# Patient Record
Sex: Female | Born: 1970 | Race: White | Hispanic: No | Marital: Single | Smoking: Current every day smoker
Health system: Southern US, Community
[De-identification: ages and names within clinical notes are randomized; demographics above are authoritative.]

## PROBLEM LIST (undated history)

## (undated) HISTORY — PX: OTHER SURGICAL HISTORY: SHX169

---

## 2008-09-08 ENCOUNTER — Ambulatory Visit: Payer: Self-pay | Admitting: Obstetrics & Gynecology

## 2008-09-08 LAB — CONVERTED CEMR LAB
Antibody Screen: NEGATIVE
Basophils Absolute: 0 10*3/uL (ref 0.0–0.1)
Basophils Relative: 0 % (ref 0–1)
Eosinophils Absolute: 0.1 10*3/uL (ref 0.0–0.7)
Eosinophils Relative: 1 % (ref 0–5)
HCT: 36 % (ref 36.0–46.0)
Hemoglobin: 12.1 g/dL (ref 12.0–15.0)
Hepatitis B Surface Ag: NEGATIVE
Lymphocytes Relative: 16 % (ref 12–46)
Lymphs Abs: 1.3 10*3/uL (ref 0.7–4.0)
MCHC: 33.6 g/dL (ref 30.0–36.0)
MCV: 89.3 fL (ref 78.0–100.0)
Monocytes Absolute: 0.5 10*3/uL (ref 0.1–1.0)
Monocytes Relative: 6 % (ref 3–12)
Neutro Abs: 6.2 10*3/uL (ref 1.7–7.7)
Neutrophils Relative %: 76 % (ref 43–77)
Platelets: 277 10*3/uL (ref 150–400)
RBC: 4.03 M/uL (ref 3.87–5.11)
RDW: 12.9 % (ref 11.5–15.5)
Rh Type: POSITIVE
Rubella: 42.7 intl units/mL — ABNORMAL HIGH
WBC: 8.1 10*3/uL (ref 4.0–10.5)

## 2008-09-09 ENCOUNTER — Ambulatory Visit (HOSPITAL_COMMUNITY): Admission: RE | Admit: 2008-09-09 | Discharge: 2008-09-09 | Payer: Self-pay | Admitting: Obstetrics & Gynecology

## 2008-09-09 IMAGING — US US OB DETAIL+14 WK
1 series · 14 of 28 positions shown · non-contrast
Comparison: none

OBSTETRICAL ULTRASOUND:
 This ultrasound exam was performed in the [HOSPITAL] Ultrasound Department.  The OB US report was generated in the AS system, and faxed to the ordering physician.  This report is also available in [REDACTED] PACS.

[Series 1: us ob detail +14 wk · 0.20mm/px · 57 acquisitions, 14 frames shown]
[im 3/57]
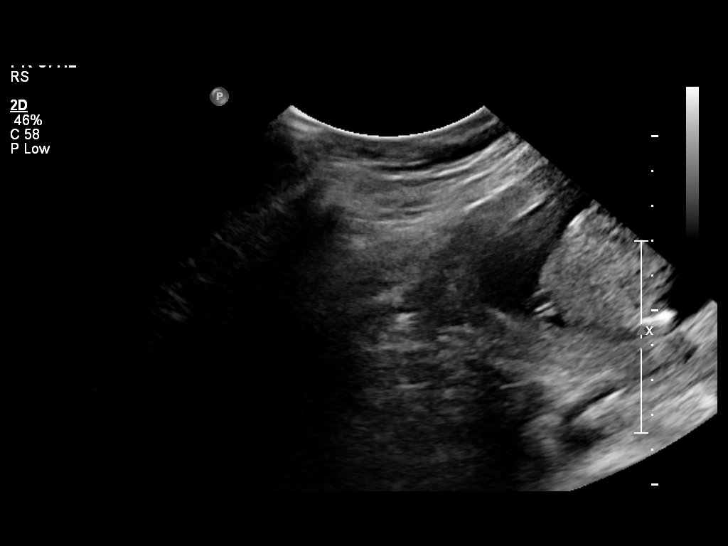
[im 7/57]
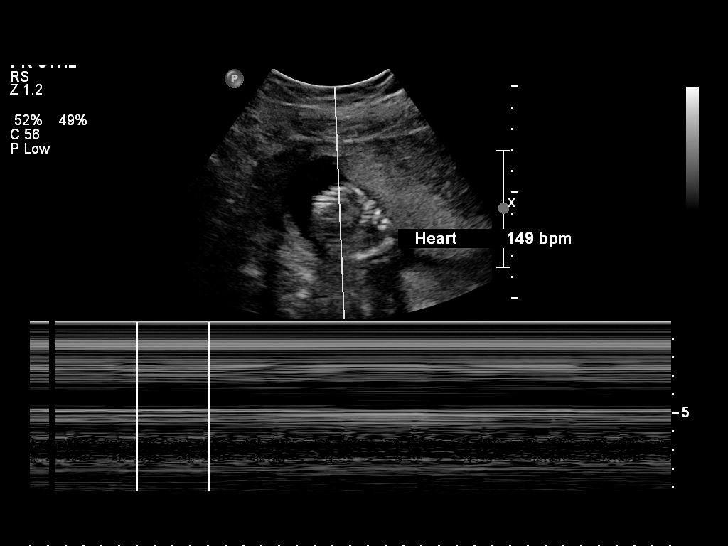
[im 11/57]
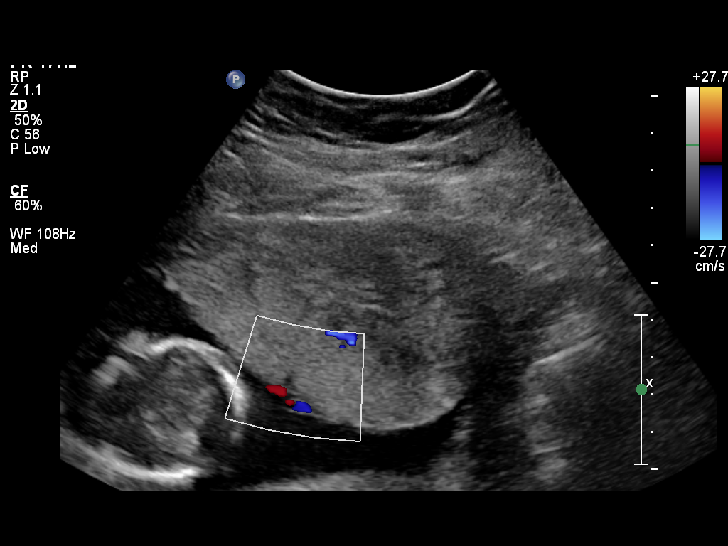
[im 15/57]
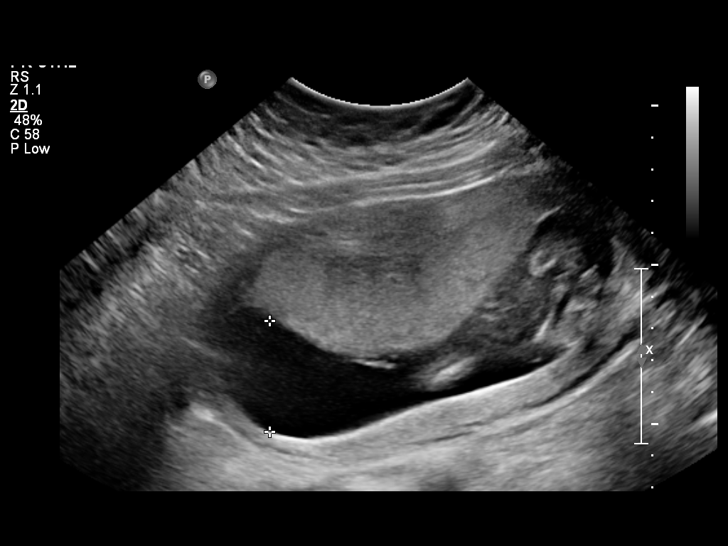
[im 19/57]
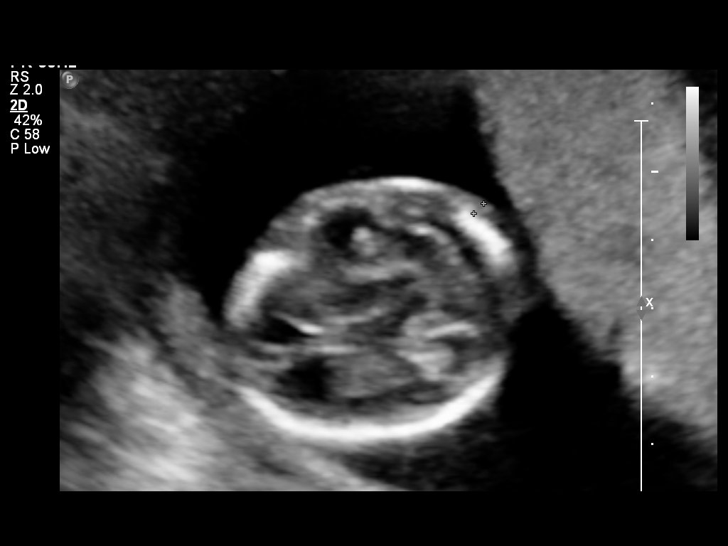
[im 23/57]
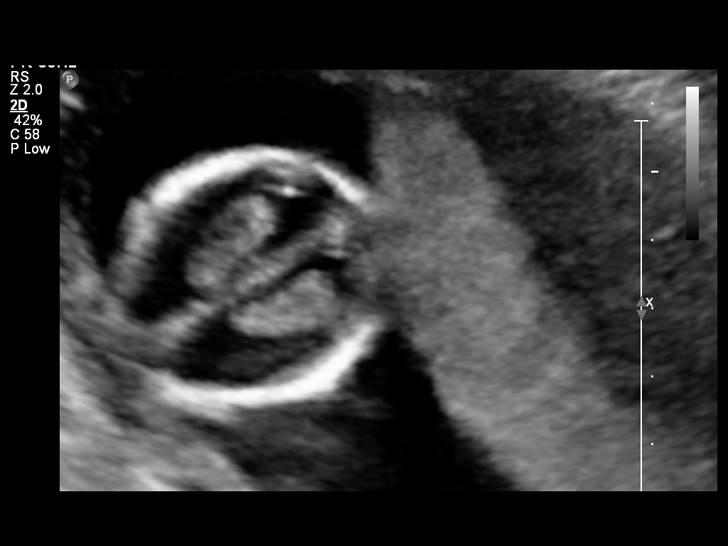
[im 27/57]
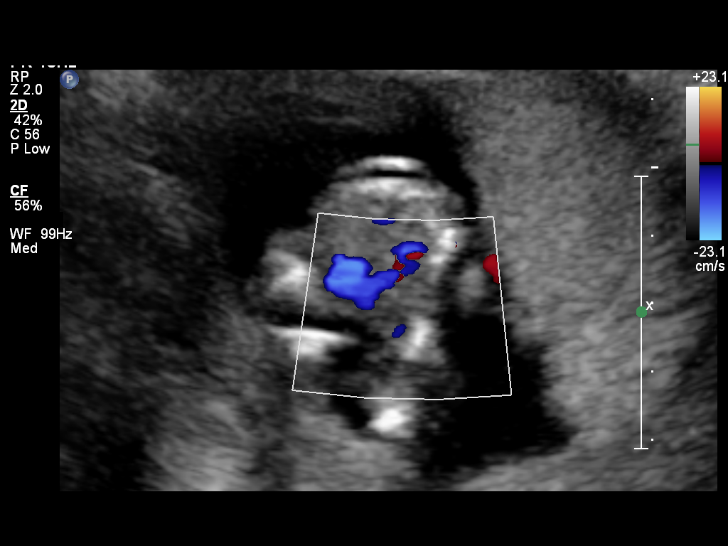
[im 32/57]
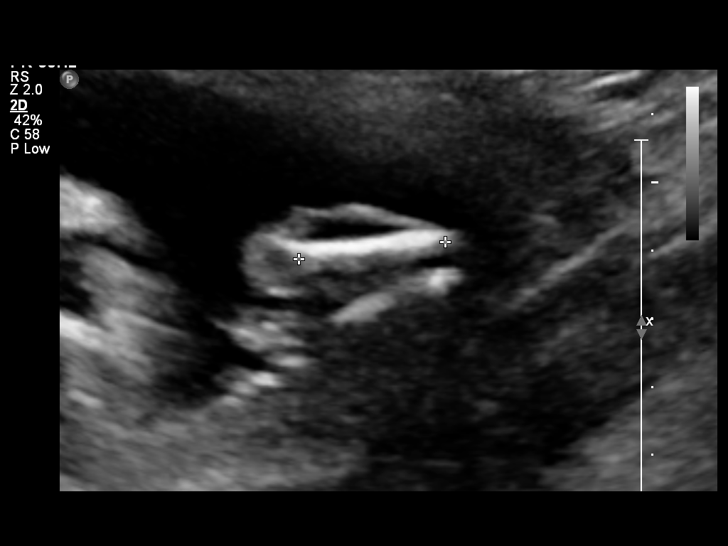
[im 36/57]
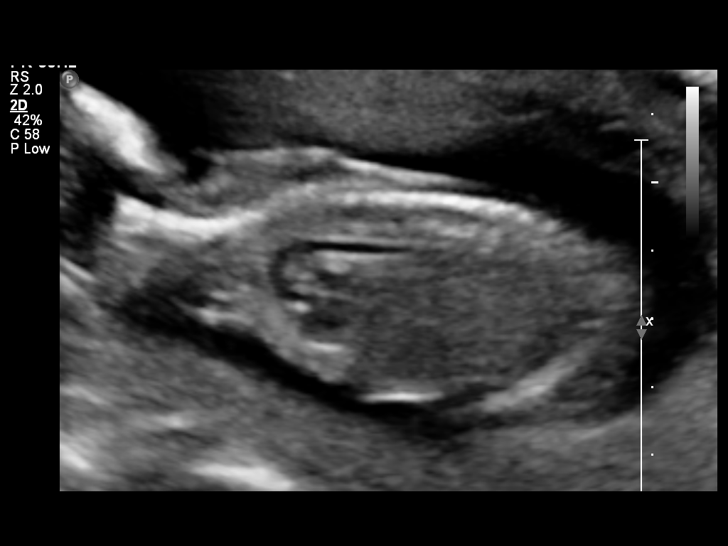
[im 40/57]
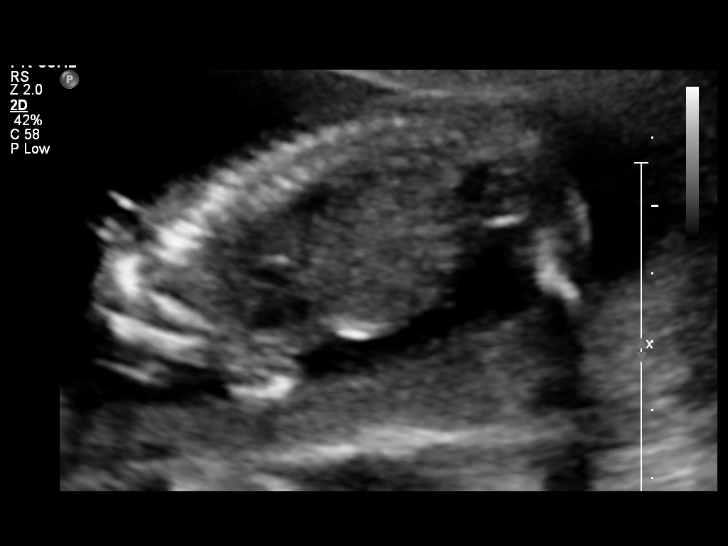
[im 44/57]
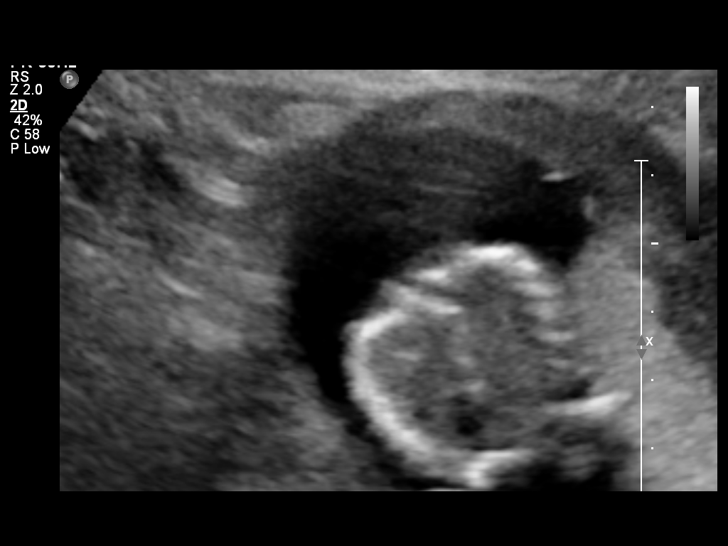
[im 48/57]
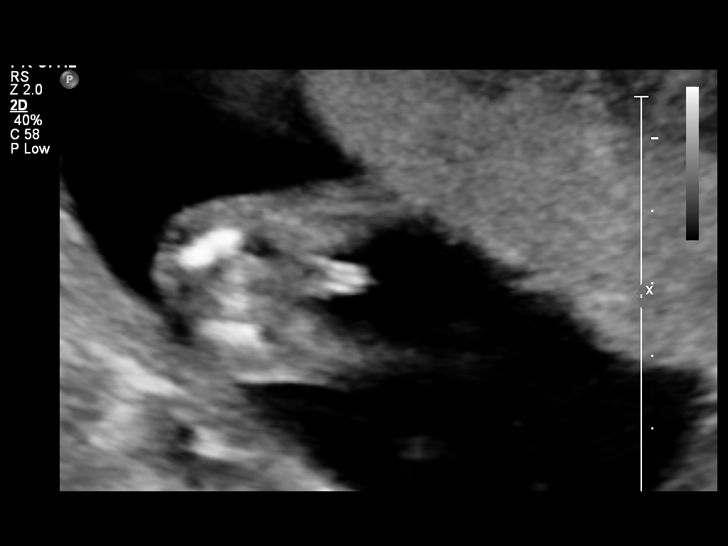
[im 52/57]
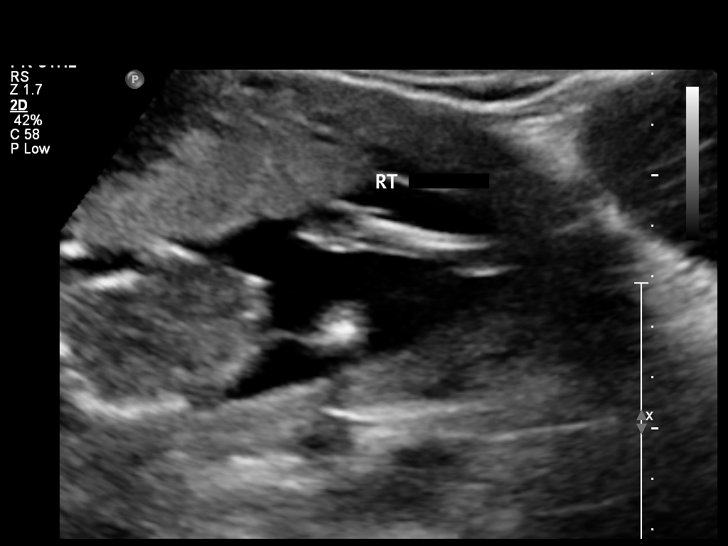
[im 57/57]
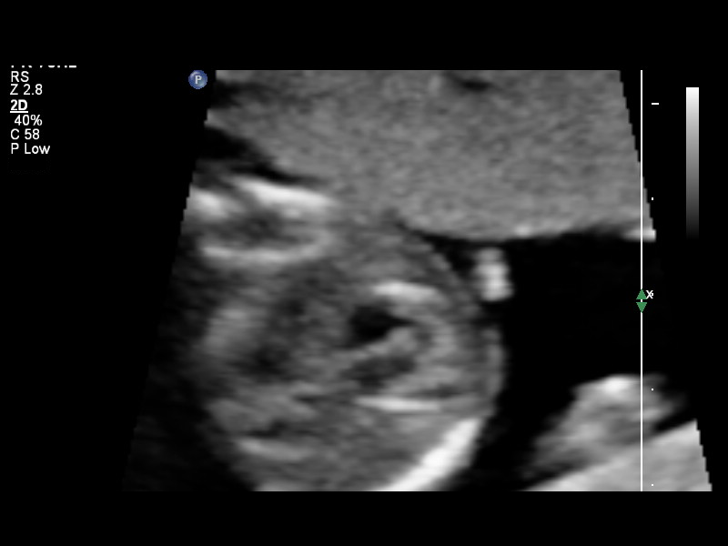

[14 of 28 positions shown; findings below may reference images not displayed]

IMPRESSION: See AS Obstetric US report.

## 2008-09-10 ENCOUNTER — Ambulatory Visit: Payer: Self-pay | Admitting: Obstetrics & Gynecology

## 2008-09-10 ENCOUNTER — Encounter: Payer: Self-pay | Admitting: Obstetrics & Gynecology

## 2008-10-01 ENCOUNTER — Ambulatory Visit (HOSPITAL_COMMUNITY): Admission: RE | Admit: 2008-10-01 | Discharge: 2008-10-01 | Payer: Self-pay | Admitting: Family Medicine

## 2008-10-01 IMAGING — US US OB FOLLOW-UP
1 series · 14 of 28 positions shown · non-contrast
Comparison: none

OBSTETRICAL ULTRASOUND:
 This ultrasound exam was performed in the [HOSPITAL] Ultrasound Department.  The OB US report was generated in the AS system, and faxed to the ordering physician.  This report is also available in [REDACTED] PACS.

[Series 1: us ob follow up · 0.27mm/px · 14 of 52 slices shown]
[im 2/52]
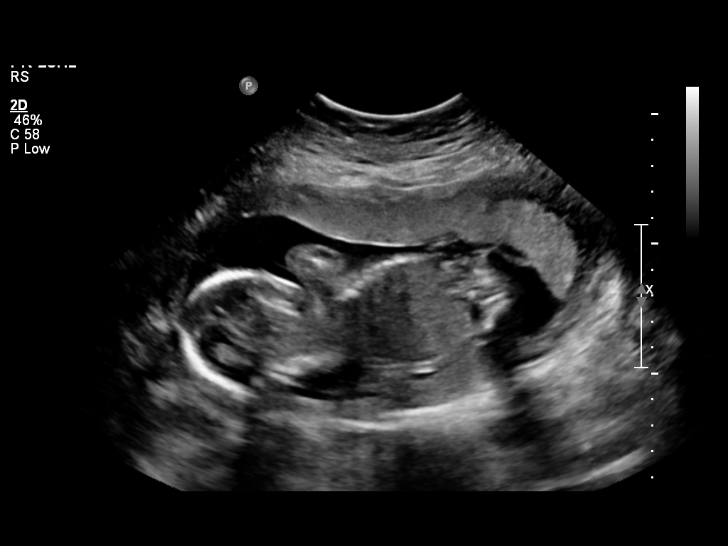
[im 6/52]
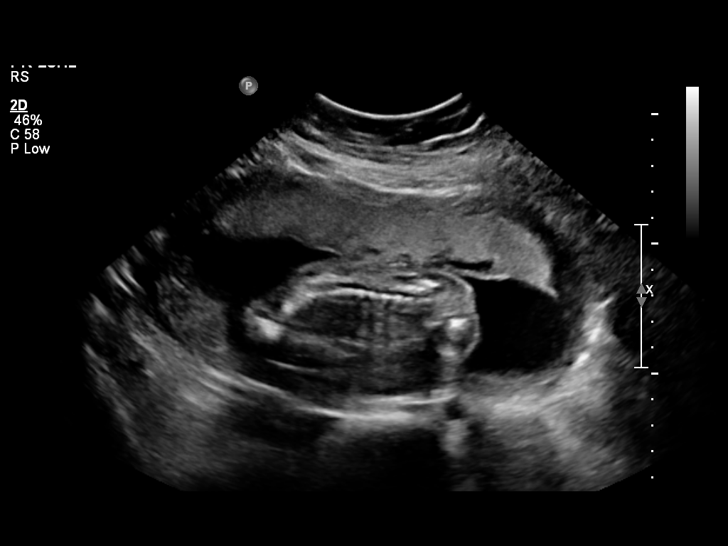
[im 10/52]
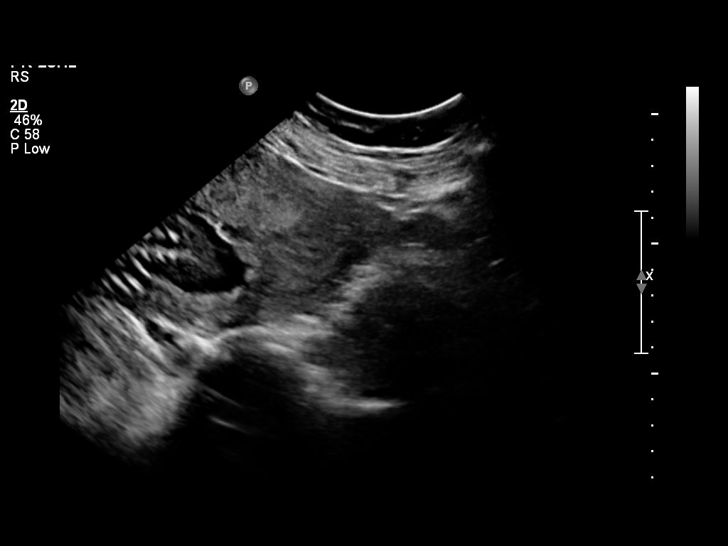
[im 14/52]
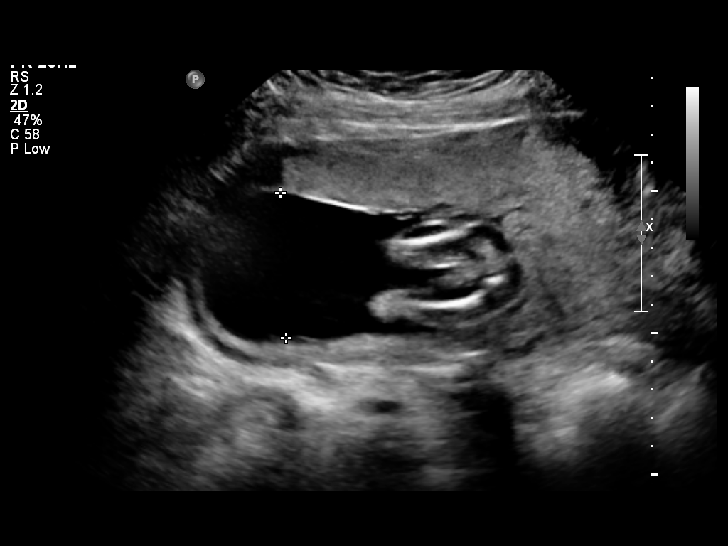
[im 18/52]
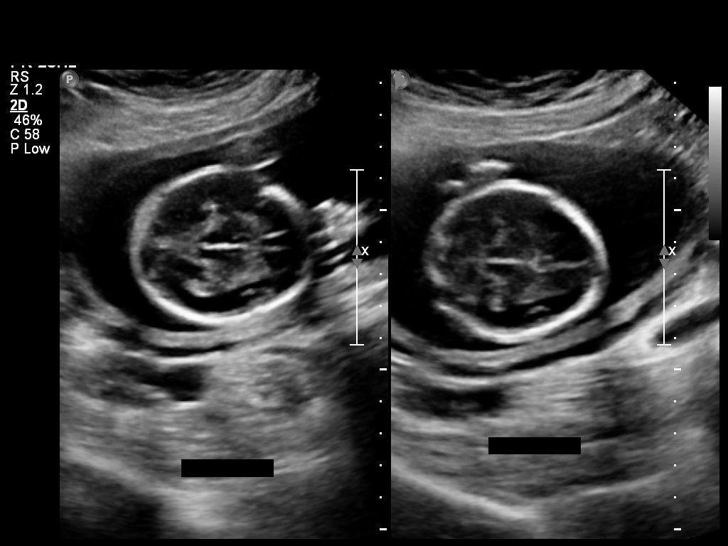
[im 21/52]
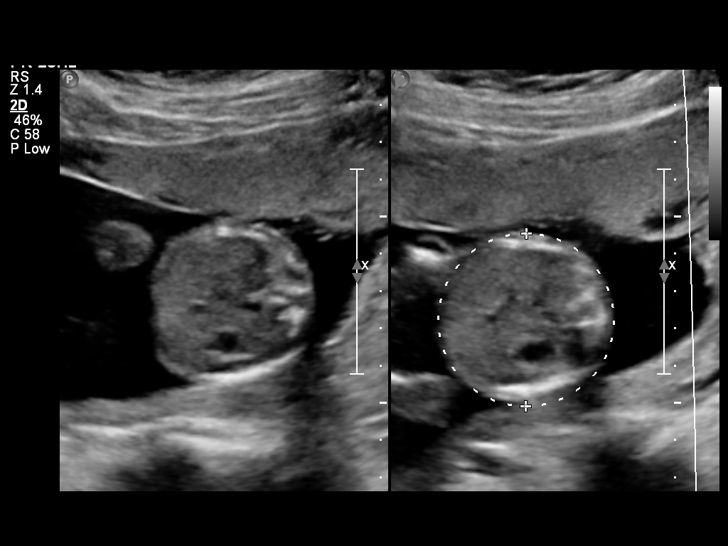
[im 25/52]
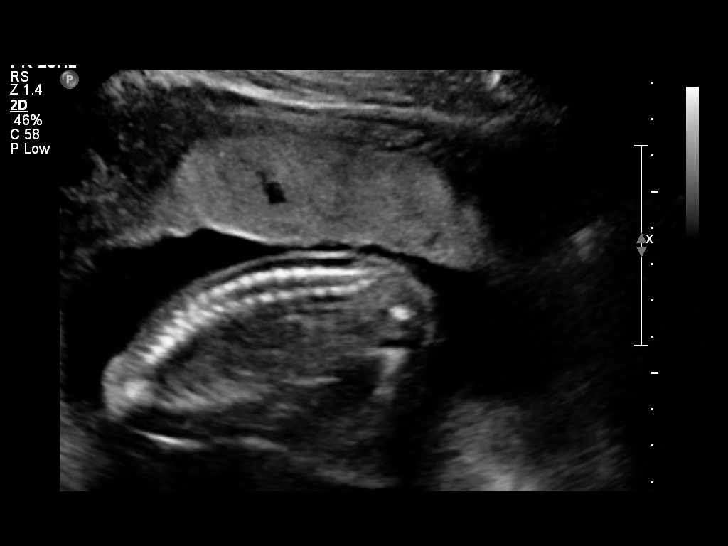
[im 29/52]
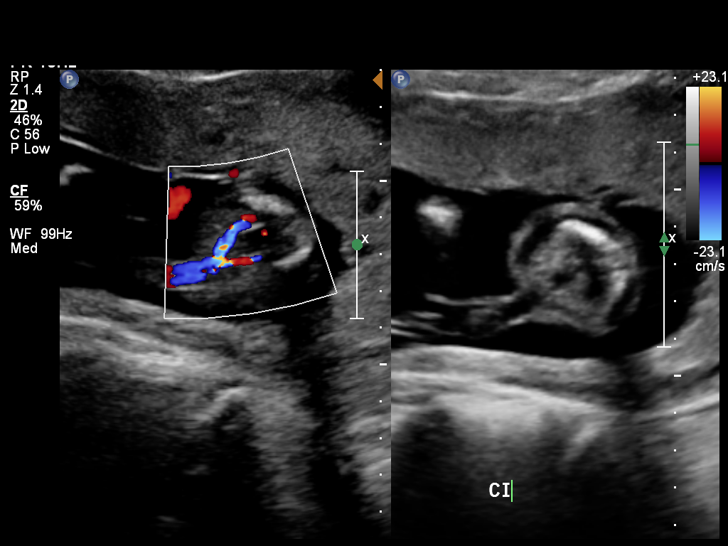
[im 33/52]
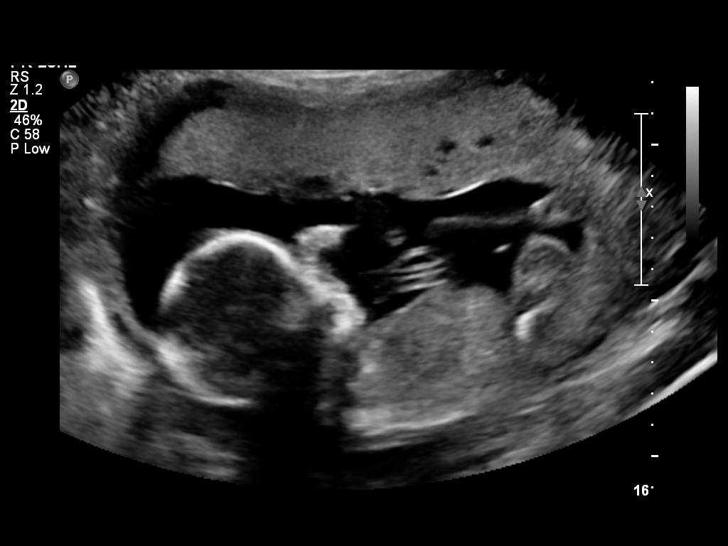
[im 36/52]
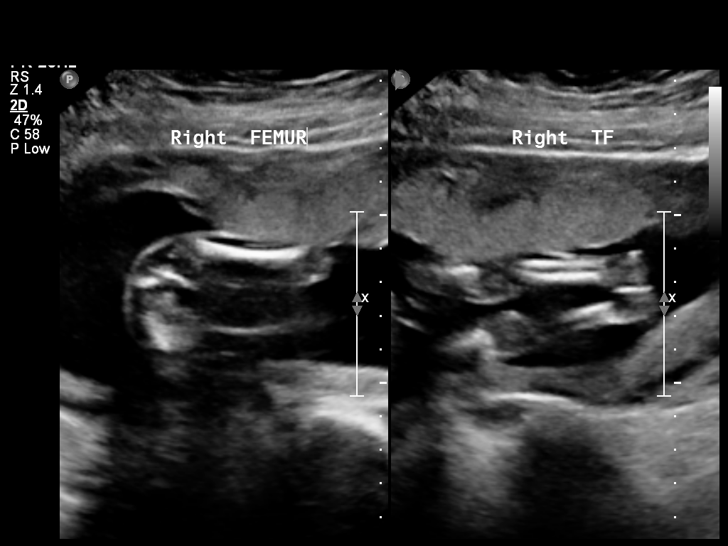
[im 40/52]
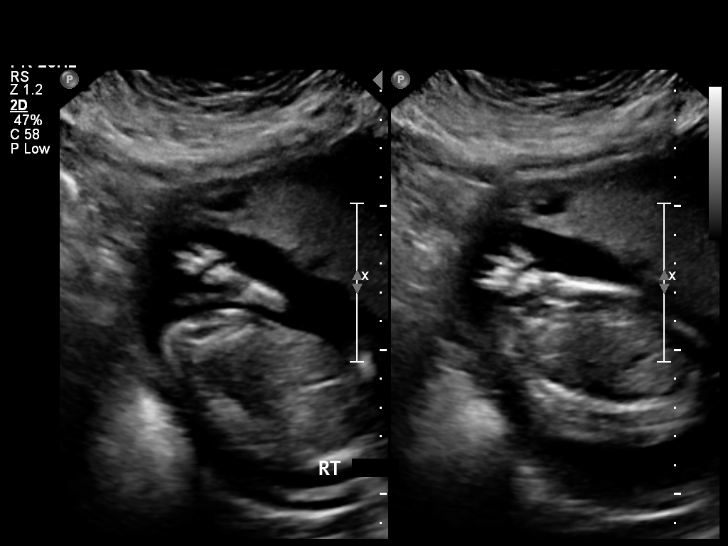
[im 44/52]
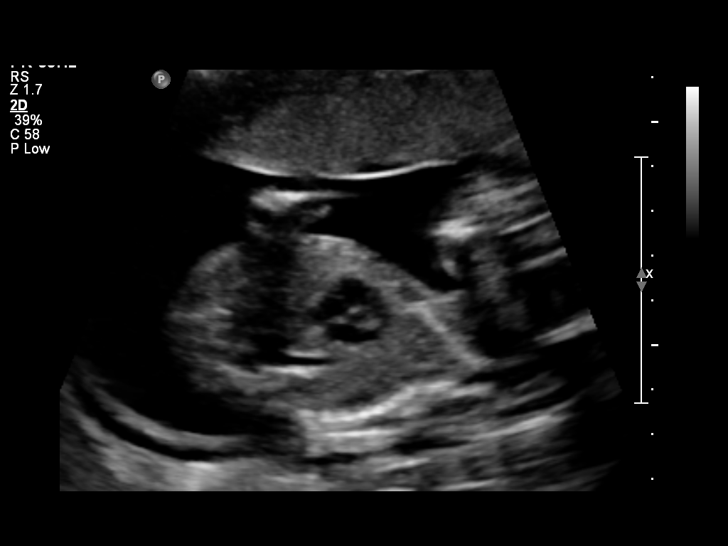
[im 48/52]
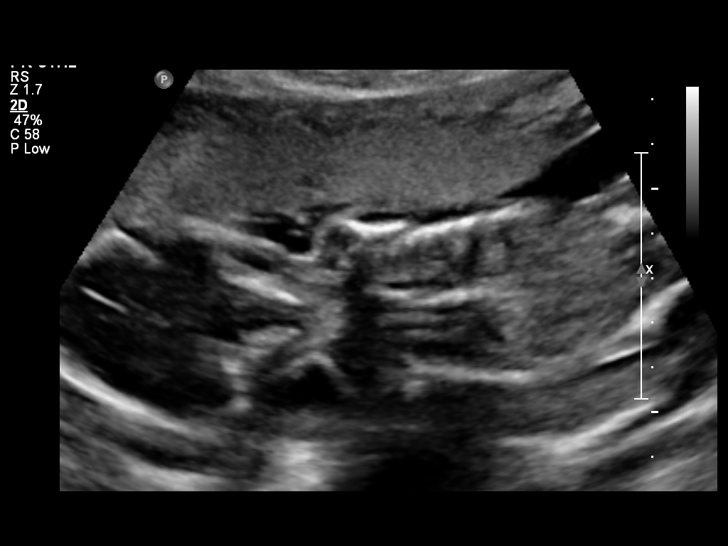
[im 52/52]
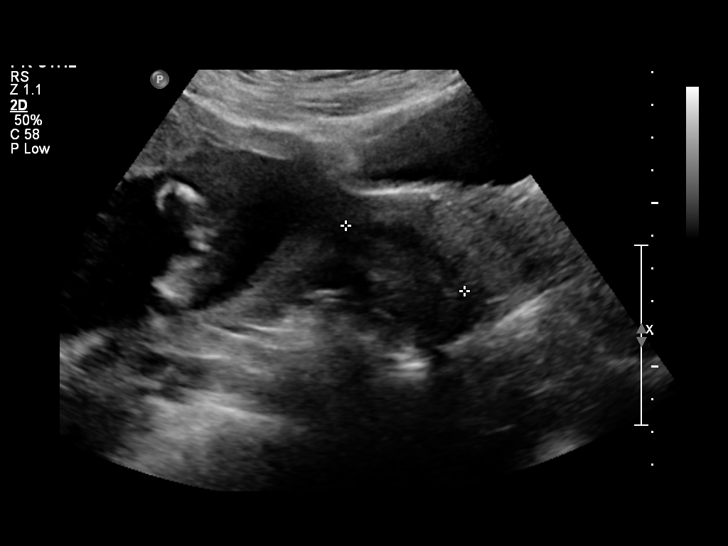

[14 of 28 positions shown; findings below may reference images not displayed]

IMPRESSION: See AS Obstetric US report.

## 2008-10-08 ENCOUNTER — Ambulatory Visit: Payer: Self-pay | Admitting: Family Medicine

## 2008-11-09 ENCOUNTER — Ambulatory Visit: Payer: Self-pay | Admitting: Obstetrics & Gynecology

## 2008-11-12 ENCOUNTER — Ambulatory Visit (HOSPITAL_COMMUNITY): Admission: RE | Admit: 2008-11-12 | Discharge: 2008-11-12 | Payer: Self-pay | Admitting: Family Medicine

## 2008-11-12 IMAGING — US US OB DETAIL+14 WK
1 series · 18 of 28 positions shown · non-contrast
Comparison: none

OBSTETRICAL ULTRASOUND:
 This ultrasound was performed in The [HOSPITAL], and the AS OB/GYN report will be stored to [REDACTED] PACS.

[Series 1: us ob detail+14 wk · 18 of 62 slices shown]
[im 1/62]
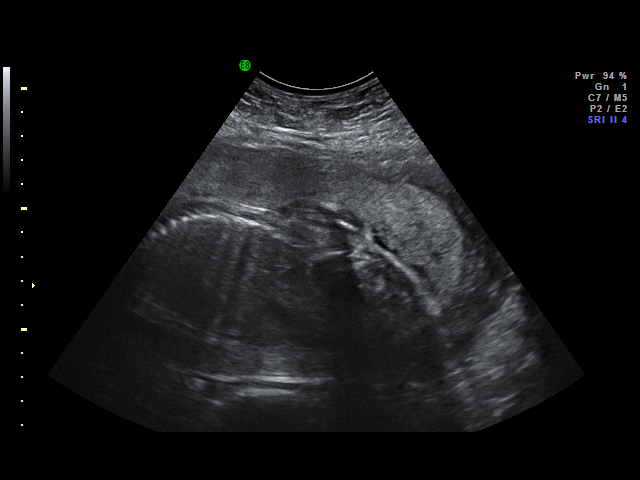
[im 5/62]
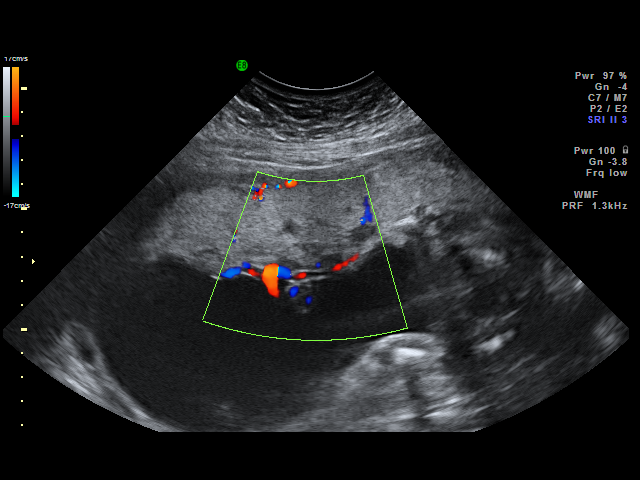
[im 7/62]
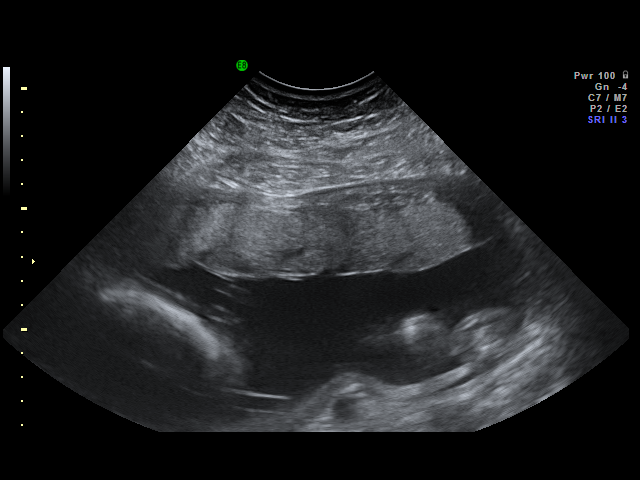
[im 12/62]
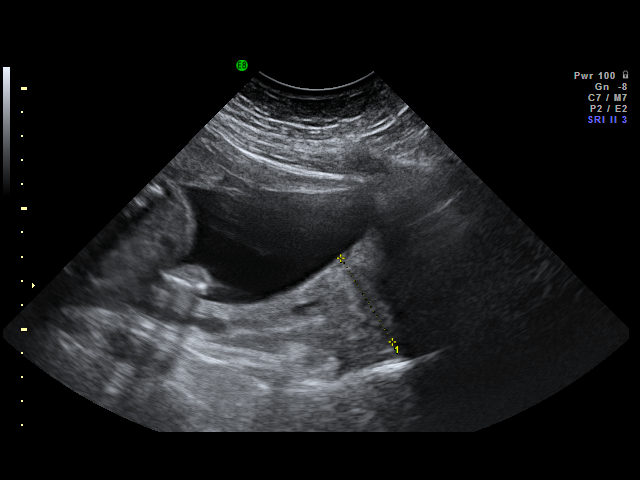
[im 16/62]
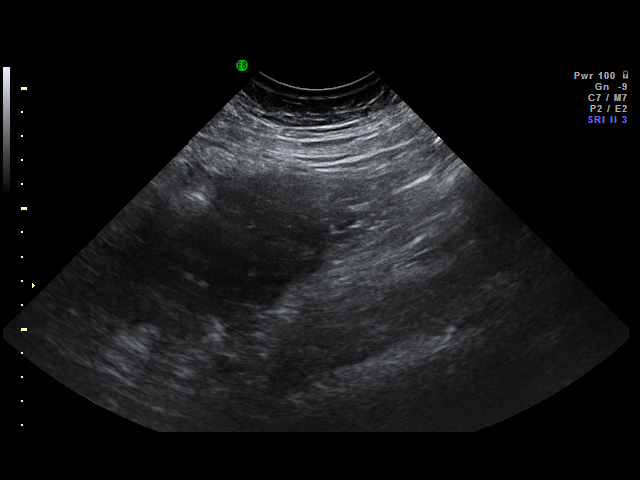
[im 19/62]
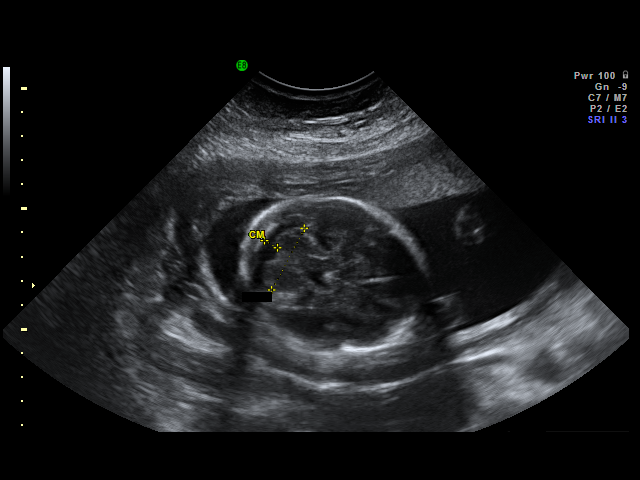
[im 23/62]
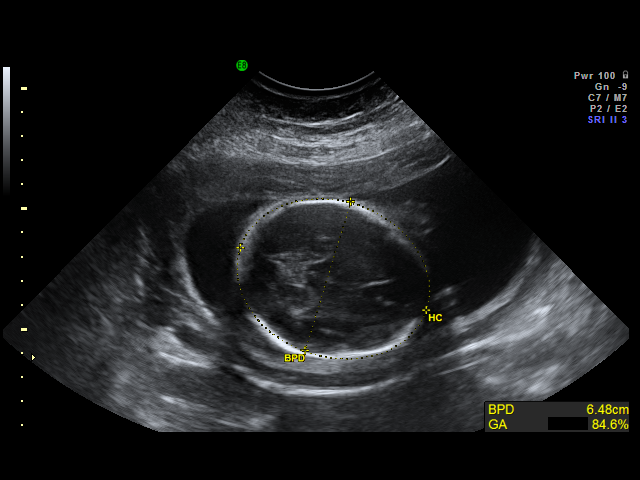
[im 25/62]
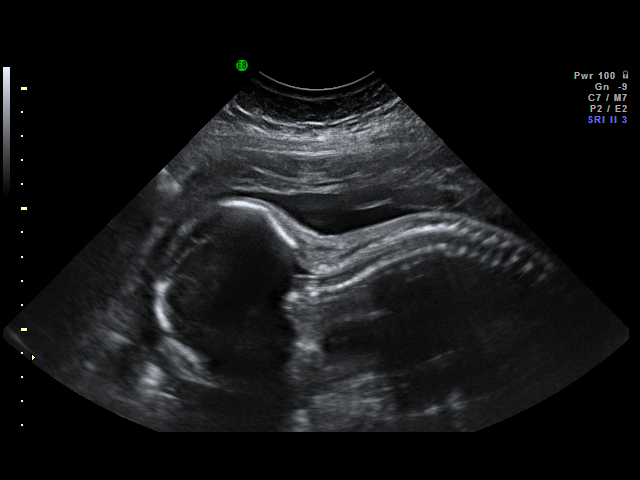
[im 30/62]
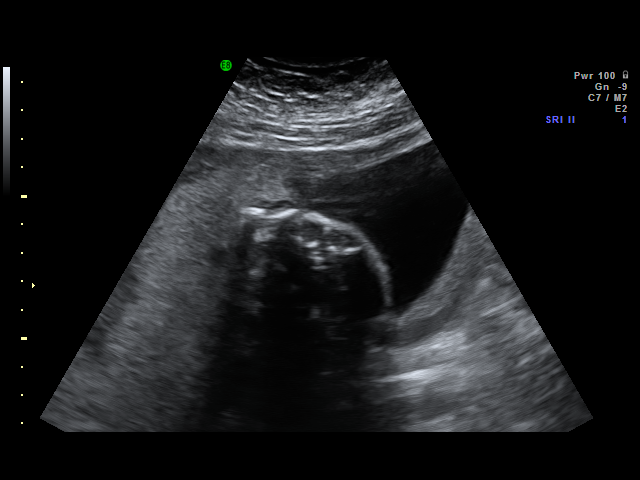
[im 32/62]
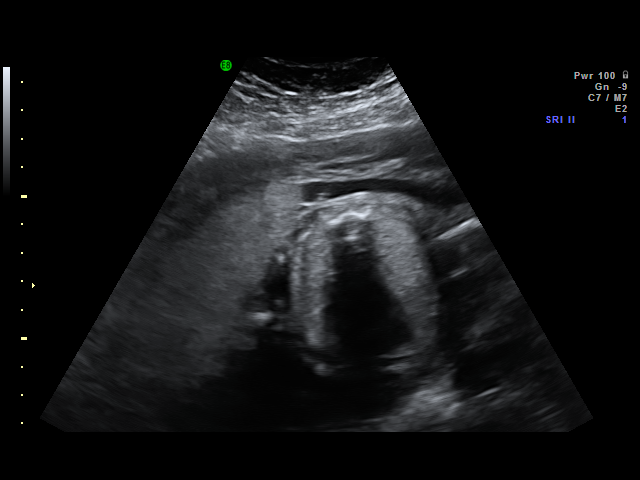
[im 37/62]
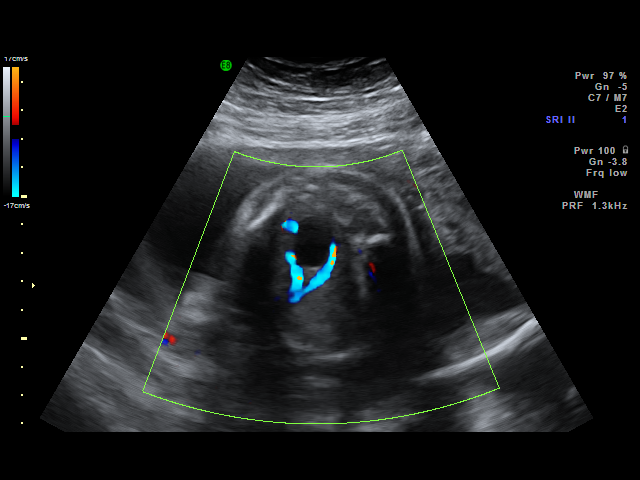
[im 39/62]
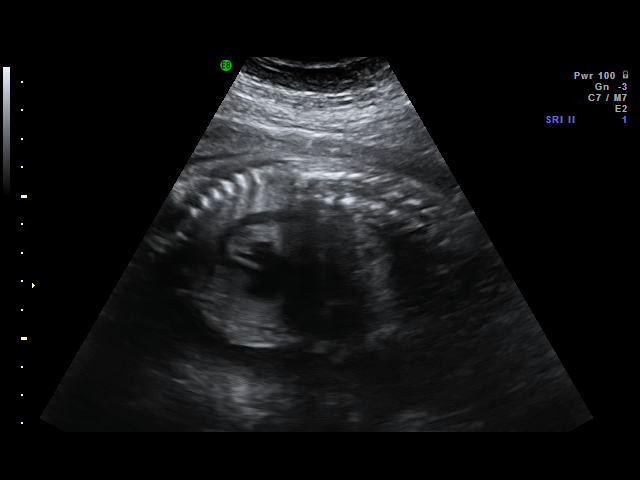
[im 43/62]
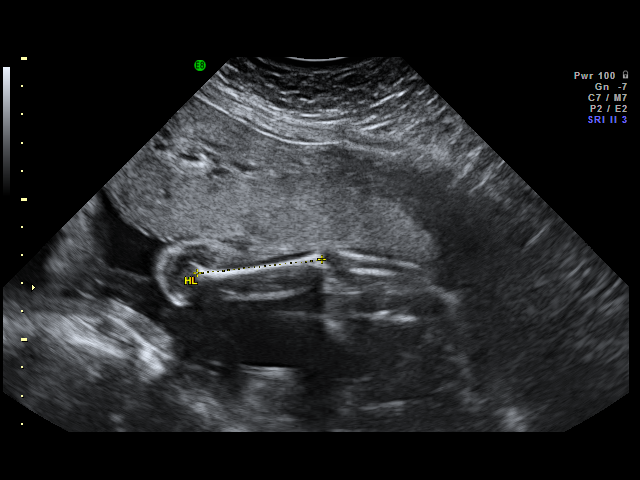
[im 48/62]
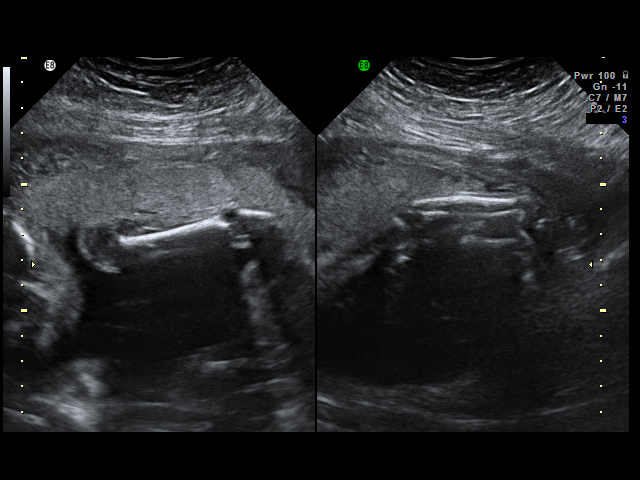
[im 50/62]
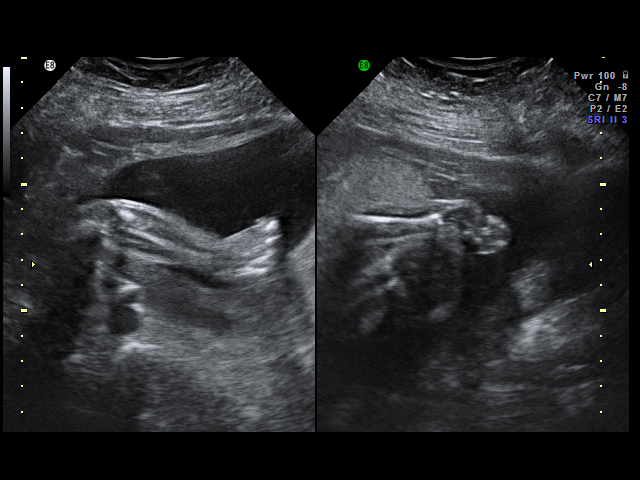
[im 55/62]
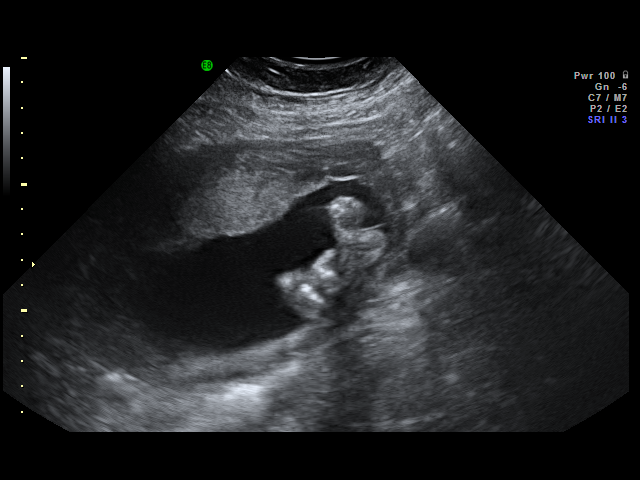
[im 57/62]
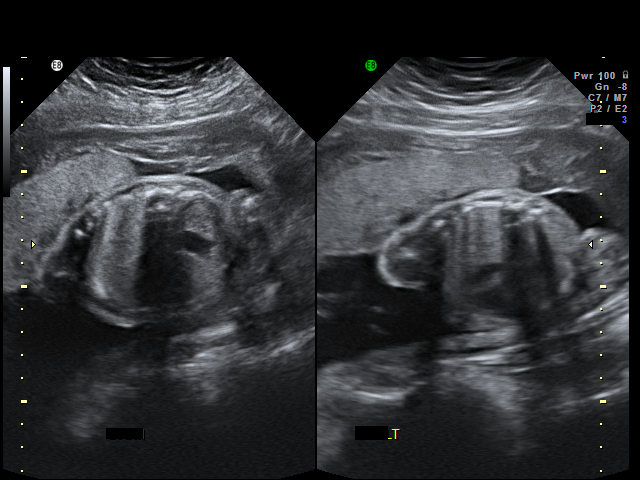
[im 62/62]
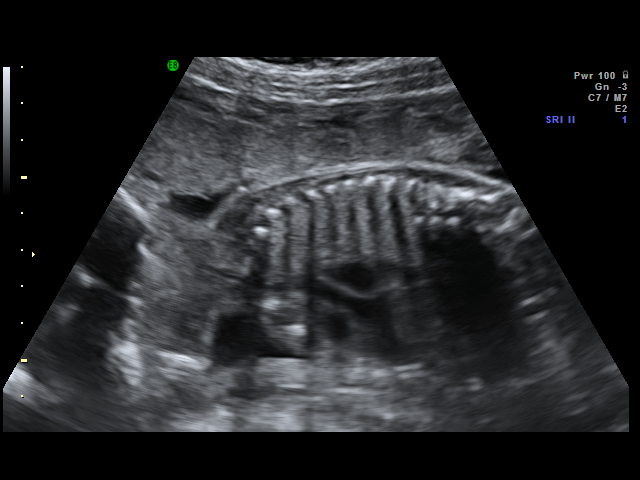

[18 of 28 positions shown; findings below may reference images not displayed]

IMPRESSION: AS OB/GYN has also been faxed to the ordering physician.

## 2008-11-23 ENCOUNTER — Ambulatory Visit: Payer: Self-pay | Admitting: Obstetrics & Gynecology

## 2008-11-23 LAB — CONVERTED CEMR LAB
HCT: 37 % (ref 36.0–46.0)
Hemoglobin: 12 g/dL (ref 12.0–15.0)
MCHC: 32.4 g/dL (ref 30.0–36.0)
MCV: 94.4 fL (ref 78.0–100.0)
Platelets: 251 10*3/uL (ref 150–400)
RBC: 3.92 M/uL (ref 3.87–5.11)
RDW: 12.8 % (ref 11.5–15.5)
WBC: 9.2 10*3/uL (ref 4.0–10.5)

## 2008-11-30 ENCOUNTER — Encounter: Payer: Self-pay | Admitting: Obstetrics & Gynecology

## 2008-12-10 ENCOUNTER — Encounter: Payer: Self-pay | Admitting: Family Medicine

## 2008-12-10 ENCOUNTER — Ambulatory Visit (HOSPITAL_COMMUNITY): Admission: RE | Admit: 2008-12-10 | Discharge: 2008-12-10 | Payer: Self-pay | Admitting: Family Medicine

## 2008-12-10 IMAGING — US US OB FOLLOW-UP
1 series · 14 of 28 positions shown · non-contrast
Comparison: none

OBSTETRICAL ULTRASOUND:
 This ultrasound was performed in The [HOSPITAL], and the AS OB/GYN report will be stored to [REDACTED] PACS.  This report is also available in [HOSPITAL]?s accessANYware.

[Series 1: us ob follow-up · 14 of 31 slices shown]
[im 2/31]
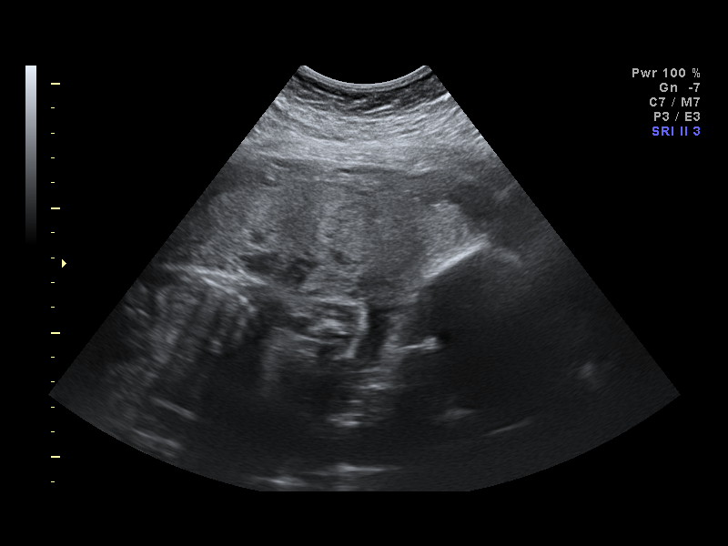
[im 4/31]
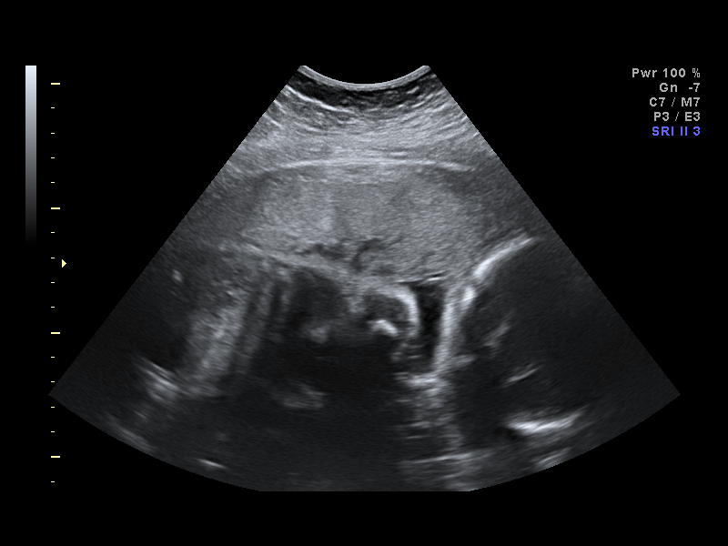
[im 6/31]
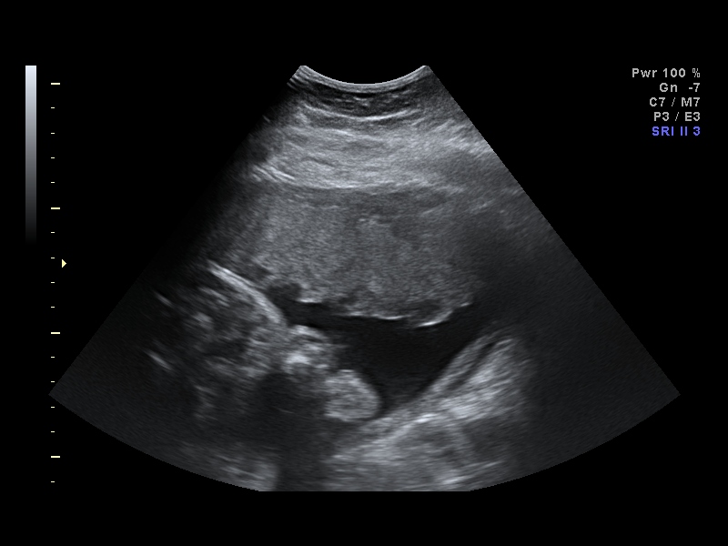
[im 8/31]
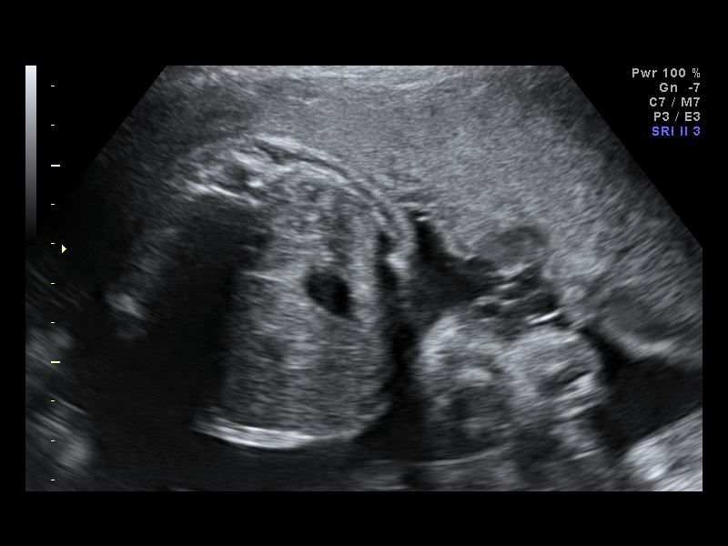
[im 11/31]
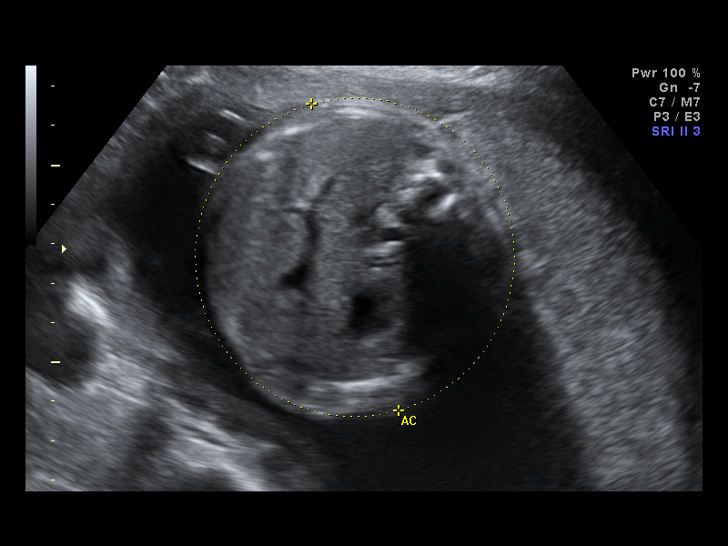
[im 13/31]
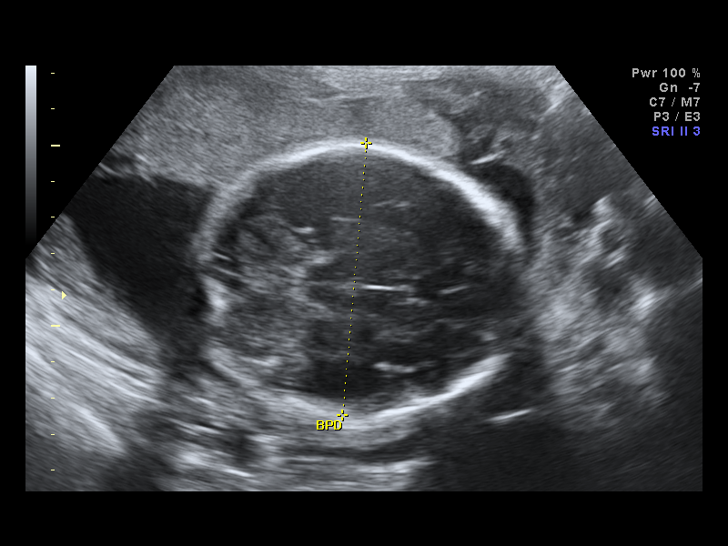
[im 15/31]
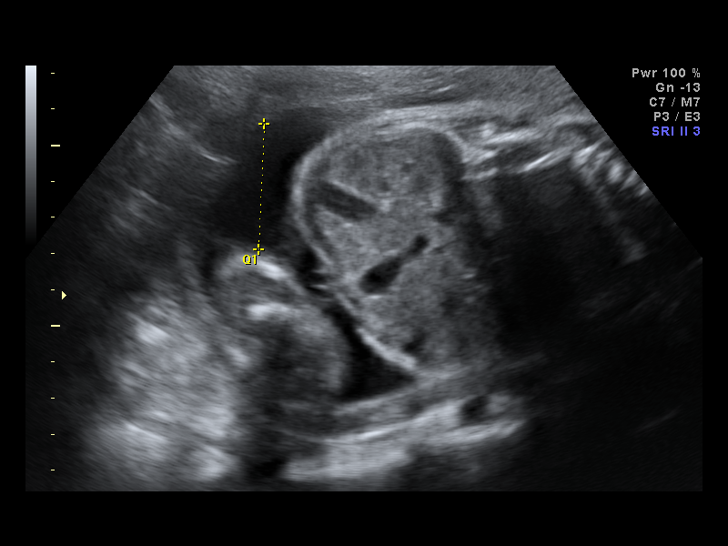
[im 17/31]
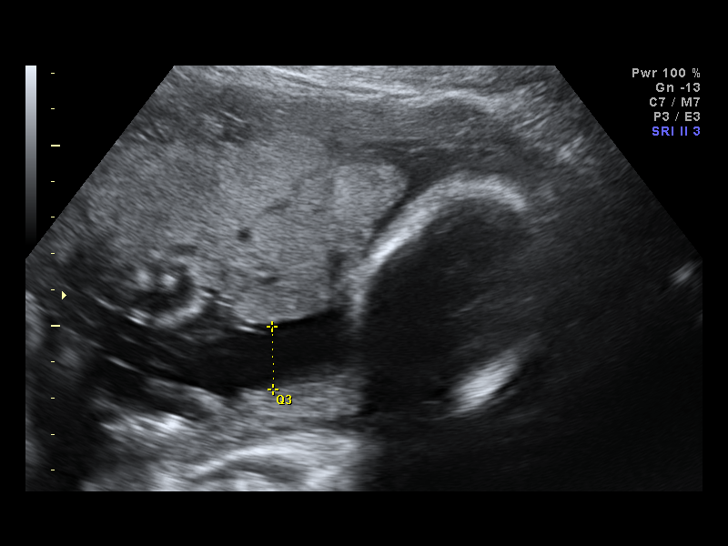
[im 19/31]
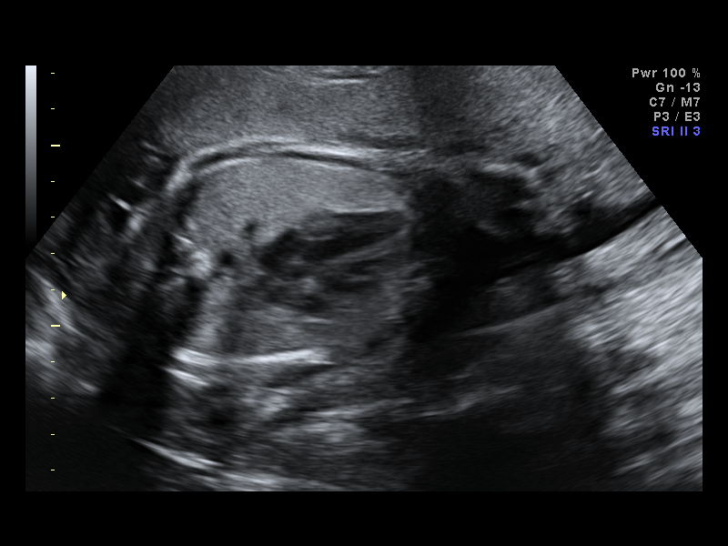
[im 22/31]
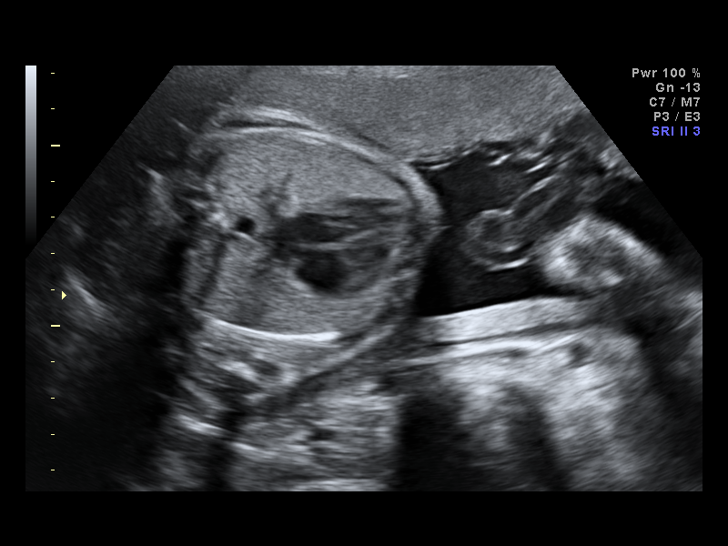
[im 24/31]
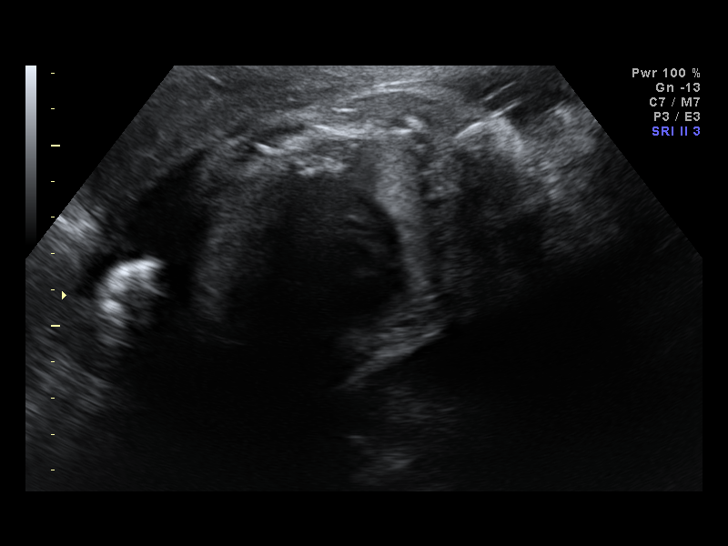
[im 26/31]
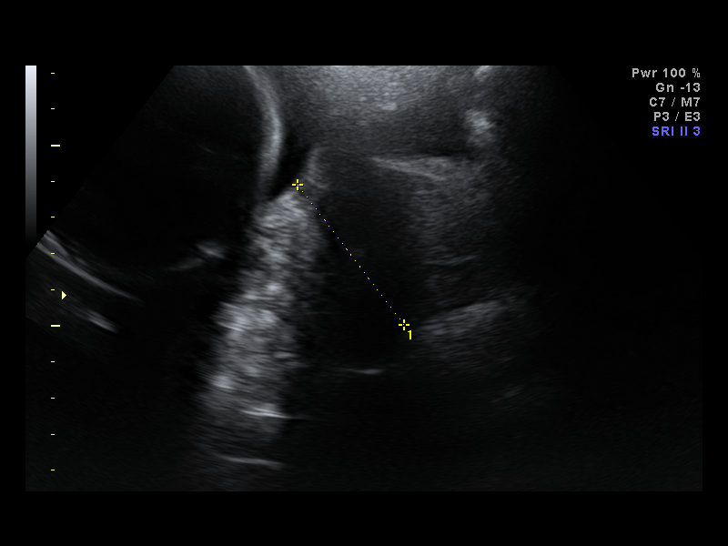
[im 28/31]
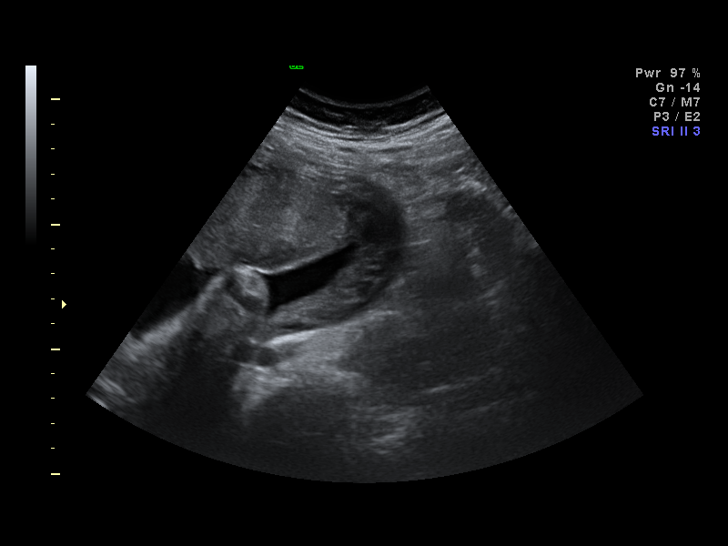
[im 31/31]
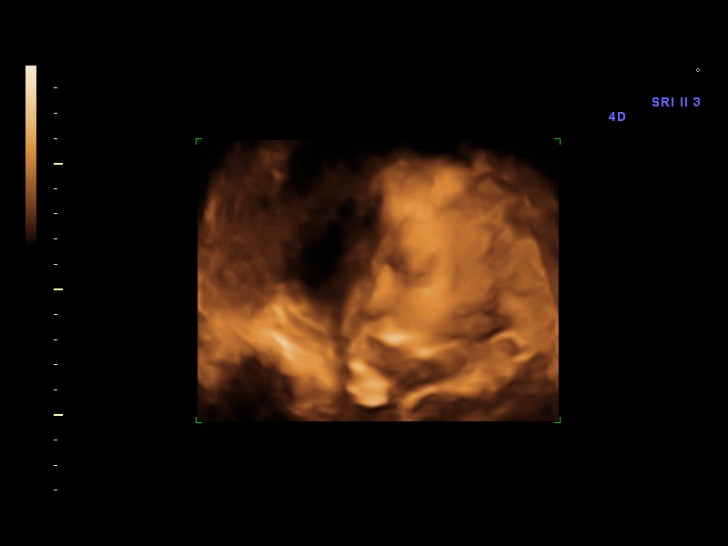

[14 of 28 positions shown; findings below may reference images not displayed]

IMPRESSION: AS OB/GYN has also been faxed to the ordering physician.

## 2008-12-14 ENCOUNTER — Ambulatory Visit: Payer: Self-pay | Admitting: Obstetrics & Gynecology

## 2008-12-28 ENCOUNTER — Ambulatory Visit: Payer: Self-pay | Admitting: Obstetrics & Gynecology

## 2009-01-01 ENCOUNTER — Inpatient Hospital Stay (HOSPITAL_COMMUNITY): Admission: AD | Admit: 2009-01-01 | Discharge: 2009-01-02 | Payer: Self-pay | Admitting: Obstetrics and Gynecology

## 2009-01-04 ENCOUNTER — Ambulatory Visit: Payer: Self-pay | Admitting: Obstetrics & Gynecology

## 2009-01-08 ENCOUNTER — Ambulatory Visit: Payer: Self-pay | Admitting: Obstetrics & Gynecology

## 2009-01-11 ENCOUNTER — Ambulatory Visit: Payer: Self-pay | Admitting: Obstetrics & Gynecology

## 2009-01-13 ENCOUNTER — Ambulatory Visit: Payer: Self-pay | Admitting: Advanced Practice Midwife

## 2009-01-13 ENCOUNTER — Inpatient Hospital Stay (HOSPITAL_COMMUNITY): Admission: AD | Admit: 2009-01-13 | Discharge: 2009-01-13 | Payer: Self-pay | Admitting: Obstetrics and Gynecology

## 2009-01-18 ENCOUNTER — Ambulatory Visit: Payer: Self-pay | Admitting: Obstetrics & Gynecology

## 2009-01-21 ENCOUNTER — Ambulatory Visit: Payer: Self-pay | Admitting: Obstetrics & Gynecology

## 2009-01-21 ENCOUNTER — Encounter: Payer: Self-pay | Admitting: Family Medicine

## 2009-01-21 ENCOUNTER — Ambulatory Visit (HOSPITAL_COMMUNITY): Admission: RE | Admit: 2009-01-21 | Discharge: 2009-01-21 | Payer: Self-pay | Admitting: Family Medicine

## 2009-01-21 IMAGING — US US OB FOLLOW-UP
1 series · 14 of 28 positions shown · non-contrast
Comparison: none

OBSTETRICAL ULTRASOUND:
 This ultrasound was performed in The [HOSPITAL], and the AS OB/GYN report will be stored to [REDACTED] PACS.  This report is also available in [HOSPITAL]?s accessANYware.

[Series 1: us ob follow-up · 14 of 32 slices shown]
[im 2/32]
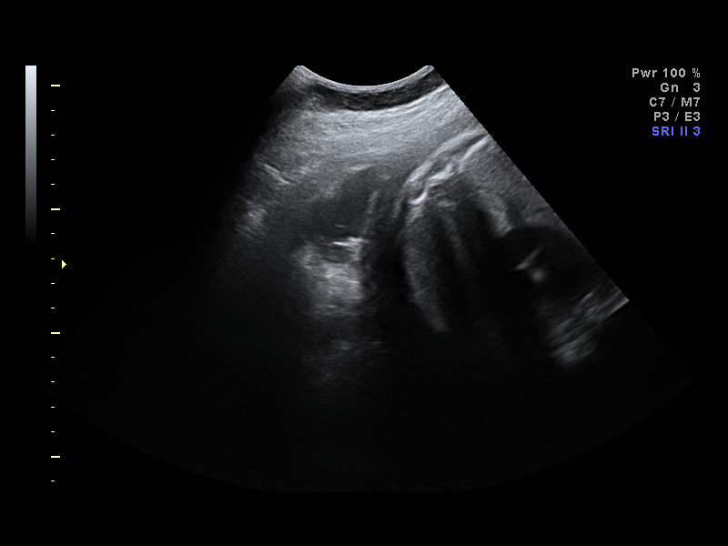
[im 4/32]
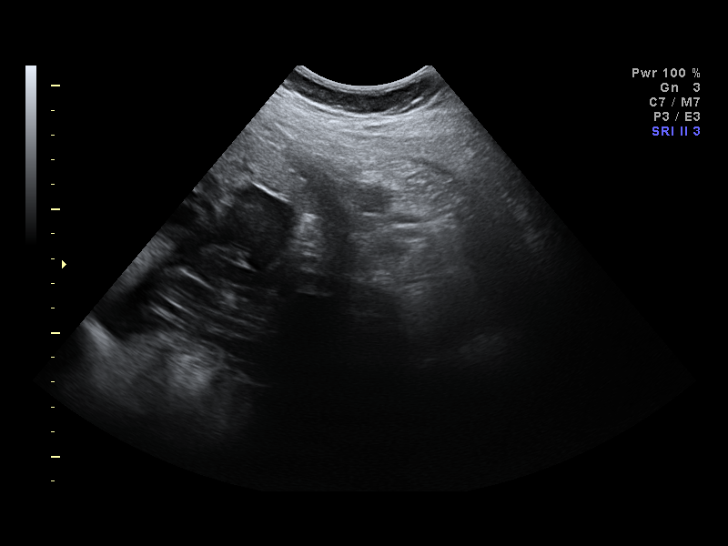
[im 6/32]
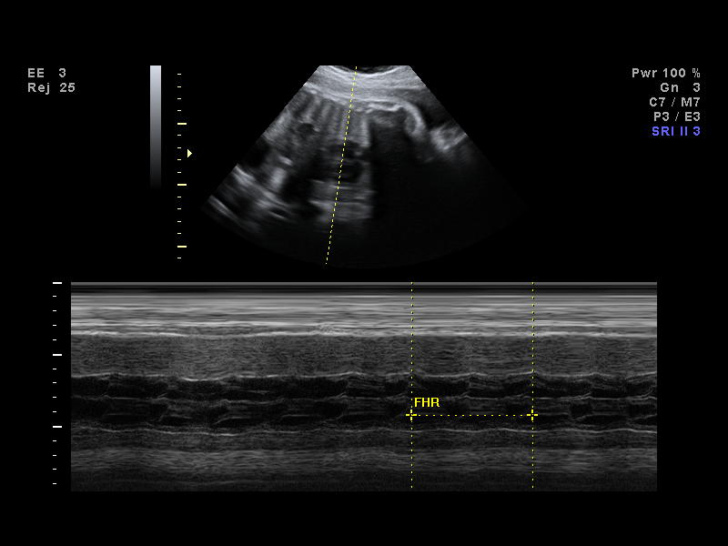
[im 9/32]
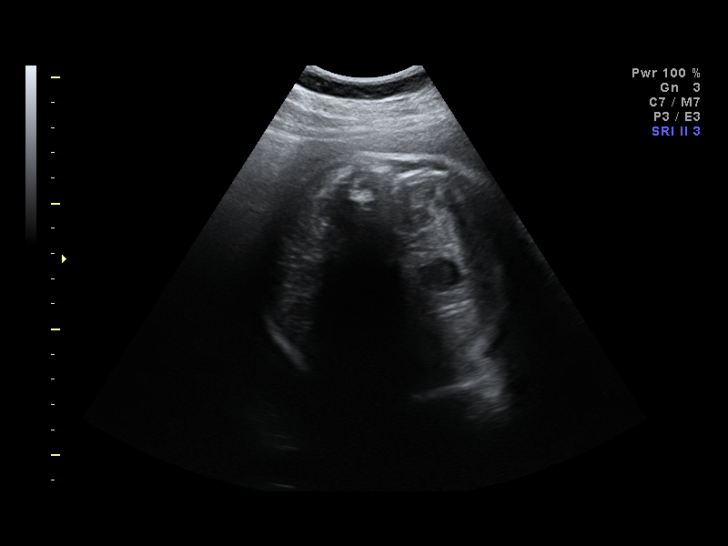
[im 11/32]
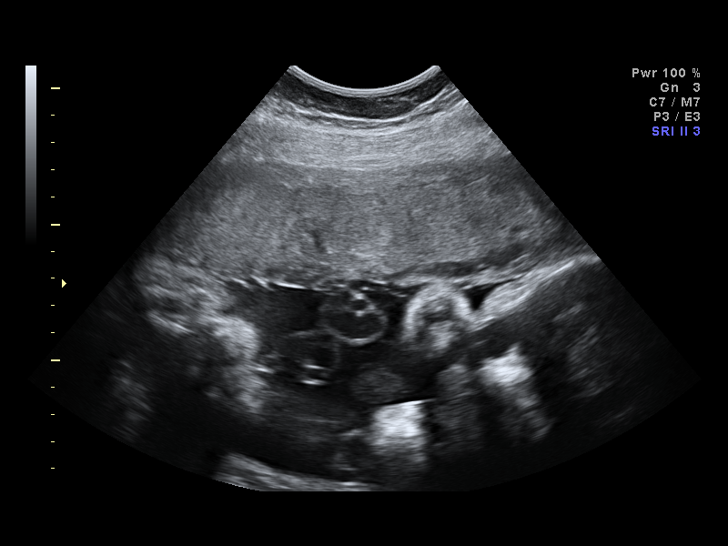
[im 13/32]
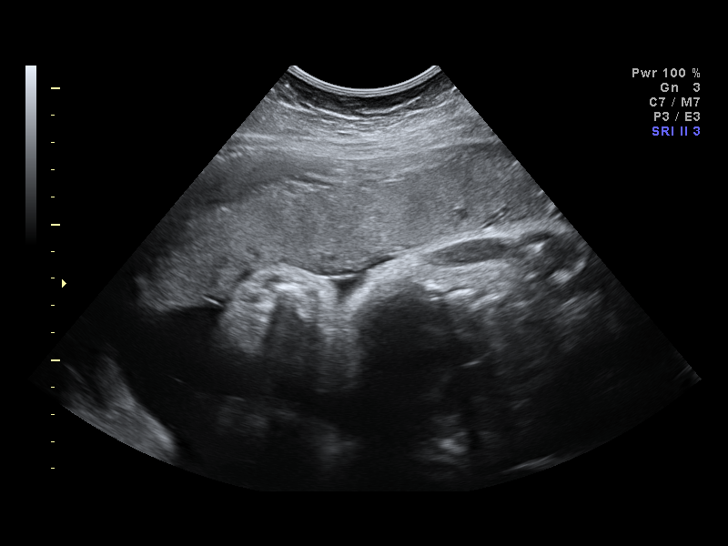
[im 15/32]
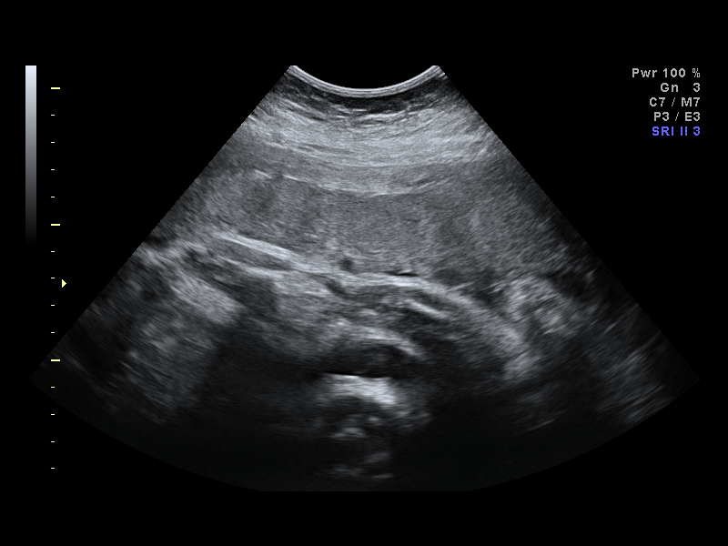
[im 18/32]
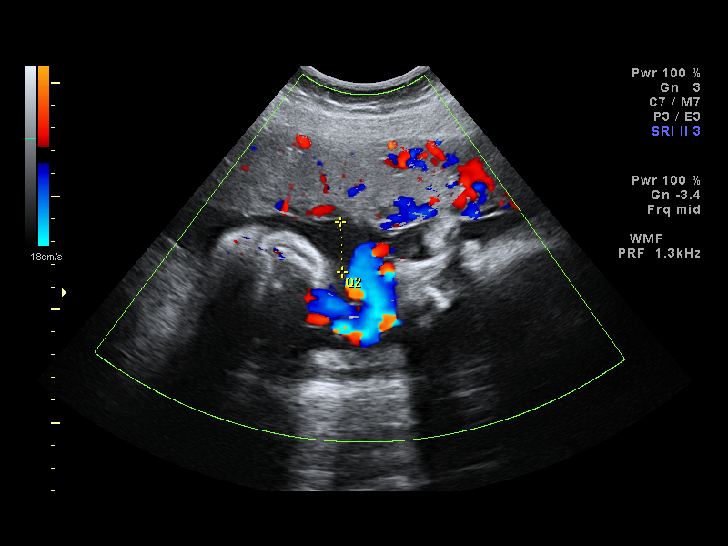
[im 20/32]
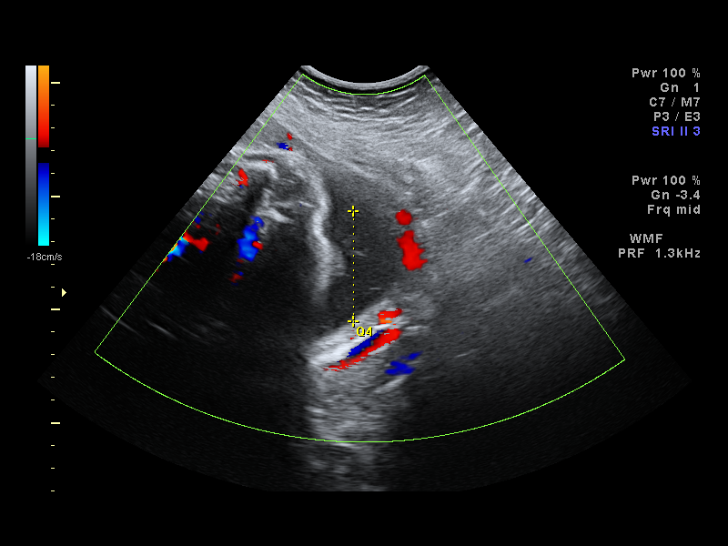
[im 22/32]
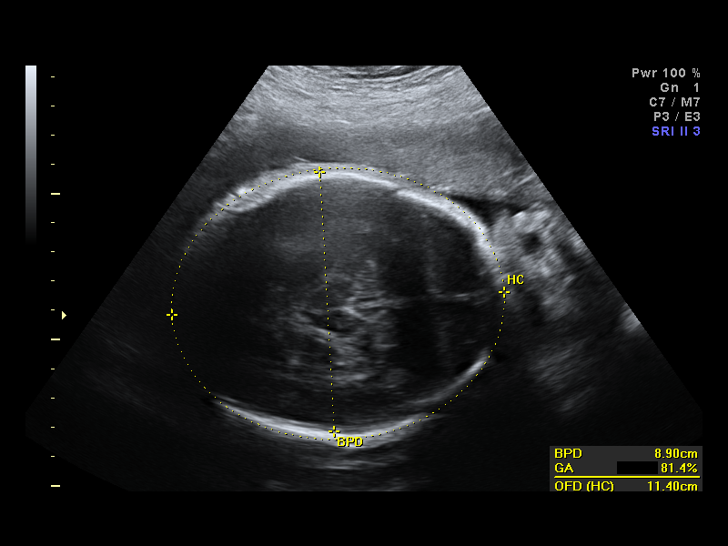
[im 25/32]
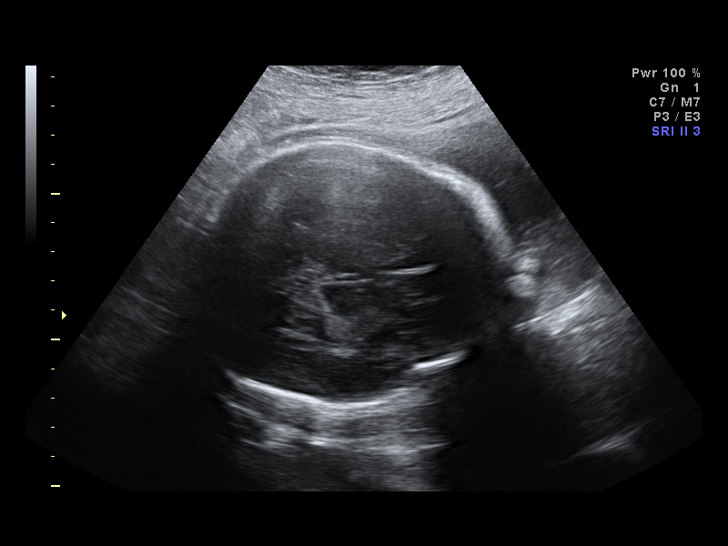
[im 27/32]
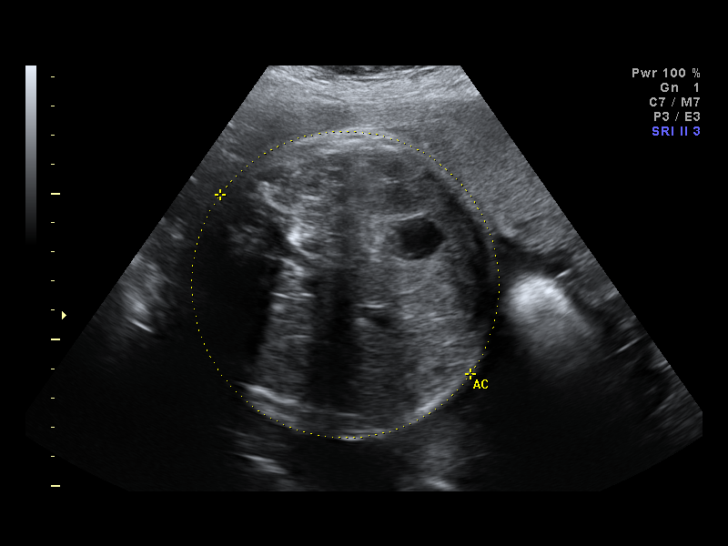
[im 29/32]
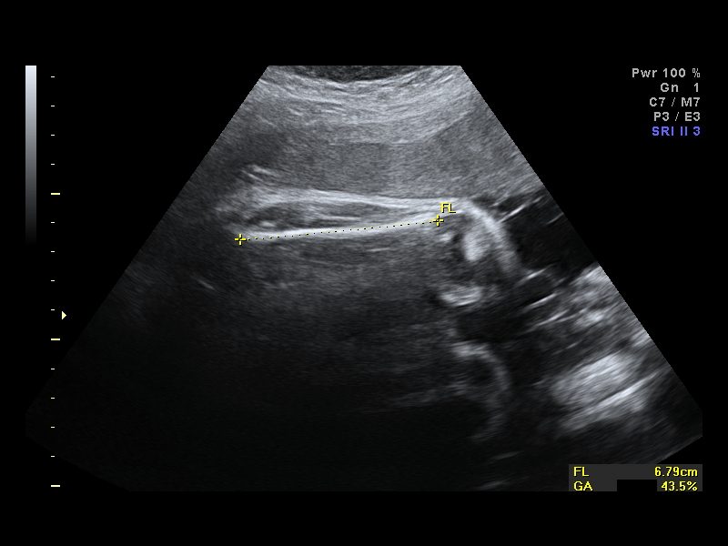
[im 32/32]
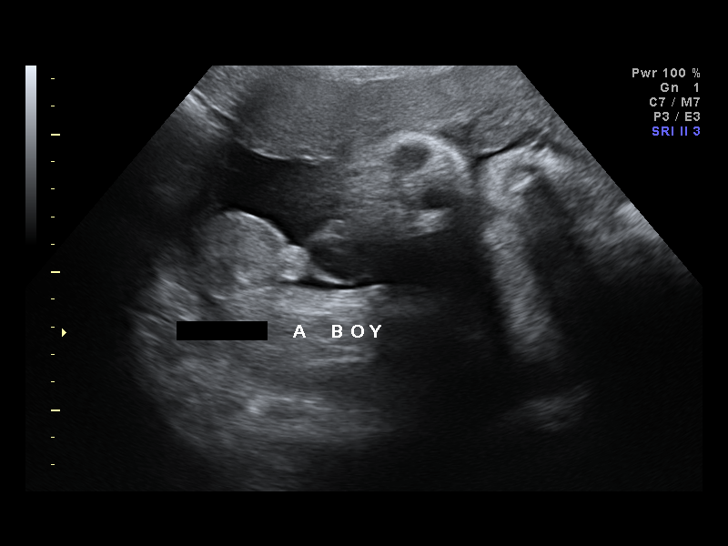

[14 of 28 positions shown; findings below may reference images not displayed]

IMPRESSION: AS OB/GYN has also been faxed to the ordering physician.

## 2009-01-25 ENCOUNTER — Ambulatory Visit: Payer: Self-pay | Admitting: Obstetrics and Gynecology

## 2009-01-28 ENCOUNTER — Ambulatory Visit: Payer: Self-pay | Admitting: Obstetrics & Gynecology

## 2009-02-01 ENCOUNTER — Ambulatory Visit: Payer: Self-pay | Admitting: Obstetrics & Gynecology

## 2009-02-02 ENCOUNTER — Encounter: Payer: Self-pay | Admitting: Obstetrics & Gynecology

## 2009-02-02 LAB — CONVERTED CEMR LAB
Chlamydia, DNA Probe: NEGATIVE
GC Probe Amp, Genital: NEGATIVE

## 2009-02-04 ENCOUNTER — Ambulatory Visit: Payer: Self-pay | Admitting: Obstetrics & Gynecology

## 2009-02-08 ENCOUNTER — Ambulatory Visit: Payer: Self-pay | Admitting: Family Medicine

## 2009-02-10 ENCOUNTER — Ambulatory Visit (HOSPITAL_COMMUNITY): Admission: RE | Admit: 2009-02-10 | Discharge: 2009-02-10 | Payer: Self-pay | Admitting: Family Medicine

## 2009-02-10 ENCOUNTER — Encounter: Payer: Self-pay | Admitting: Family Medicine

## 2009-02-10 IMAGING — US US OB FOLLOW-UP
1 series · 5 of 5 positions shown · non-contrast
Comparison: none

OBSTETRICAL ULTRASOUND:
 This ultrasound was performed in The [HOSPITAL], and the AS OB/GYN report will be stored to [REDACTED] PACS.  This report is also available in [HOSPITAL]?s accessANYware.

[Series 1: us ob follow-up · 0.26mm/px · 5 of 5 slices shown]
[im 1/5]
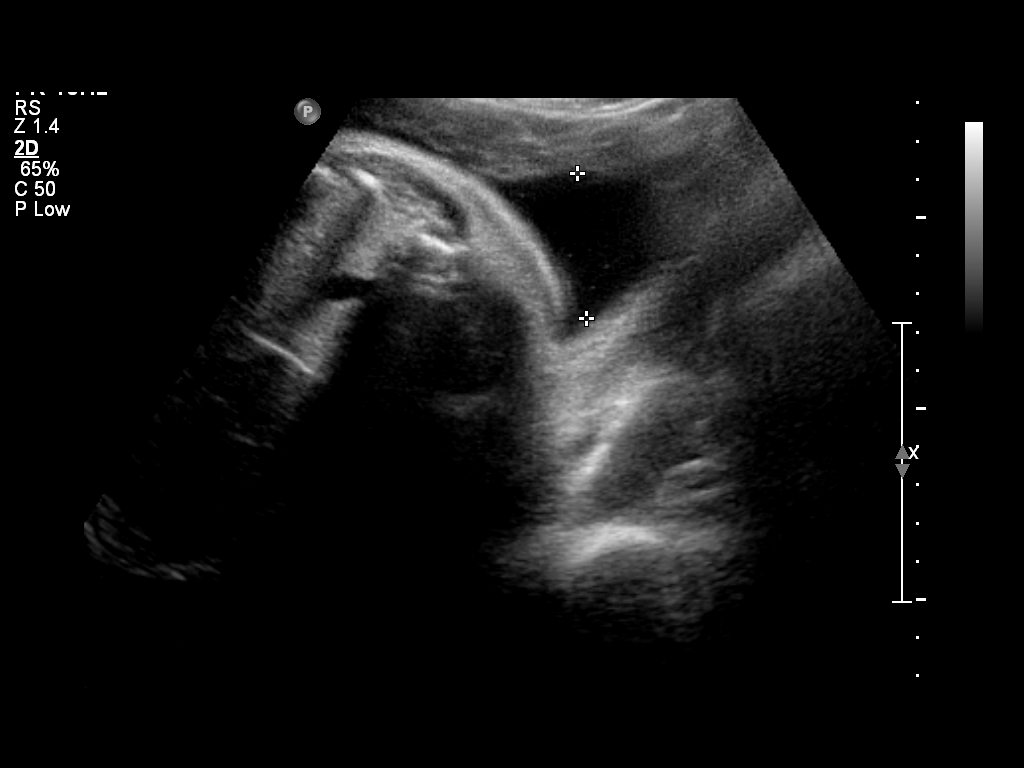
[im 2/5]
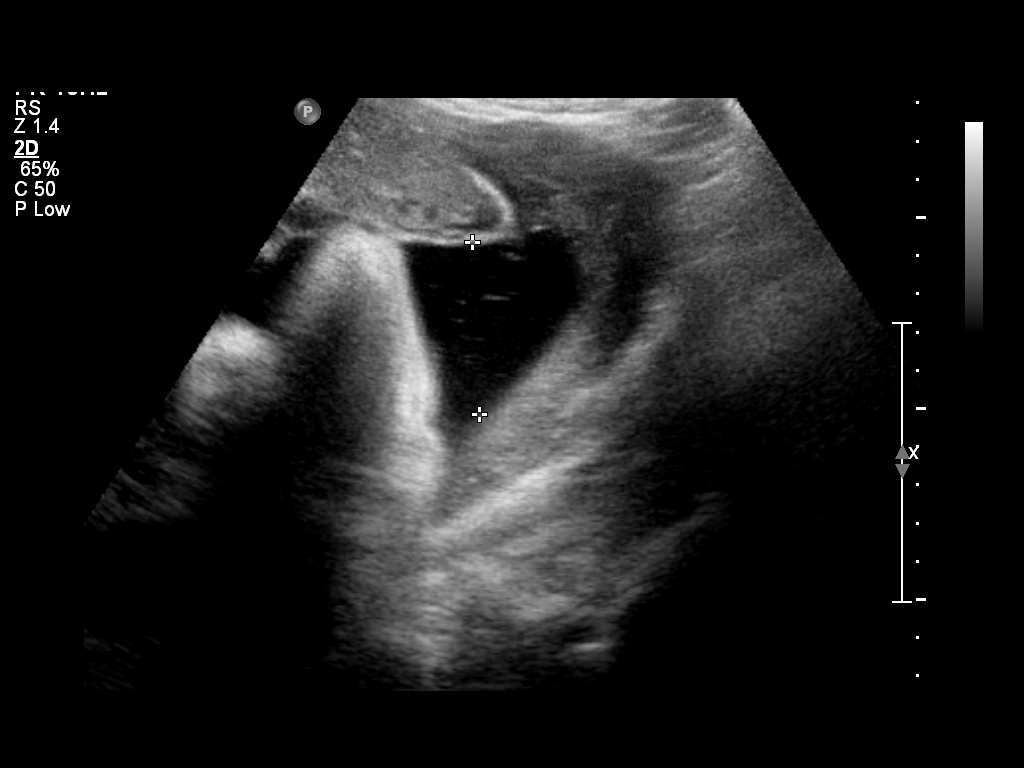
[im 3/5]
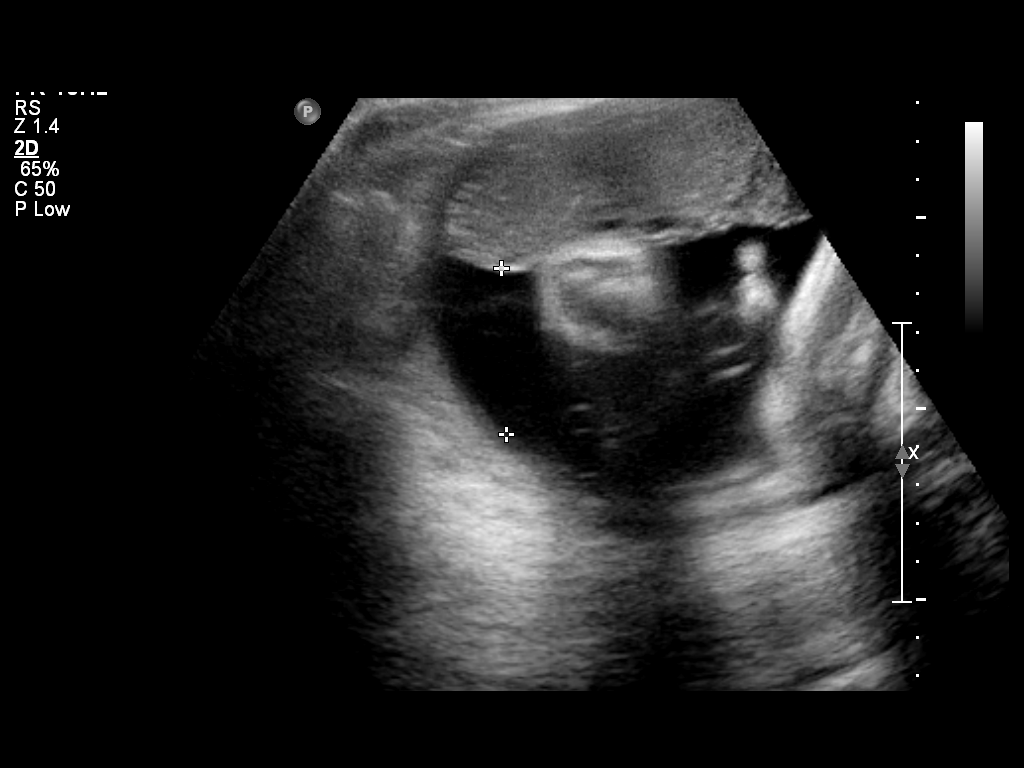
[im 4/5]
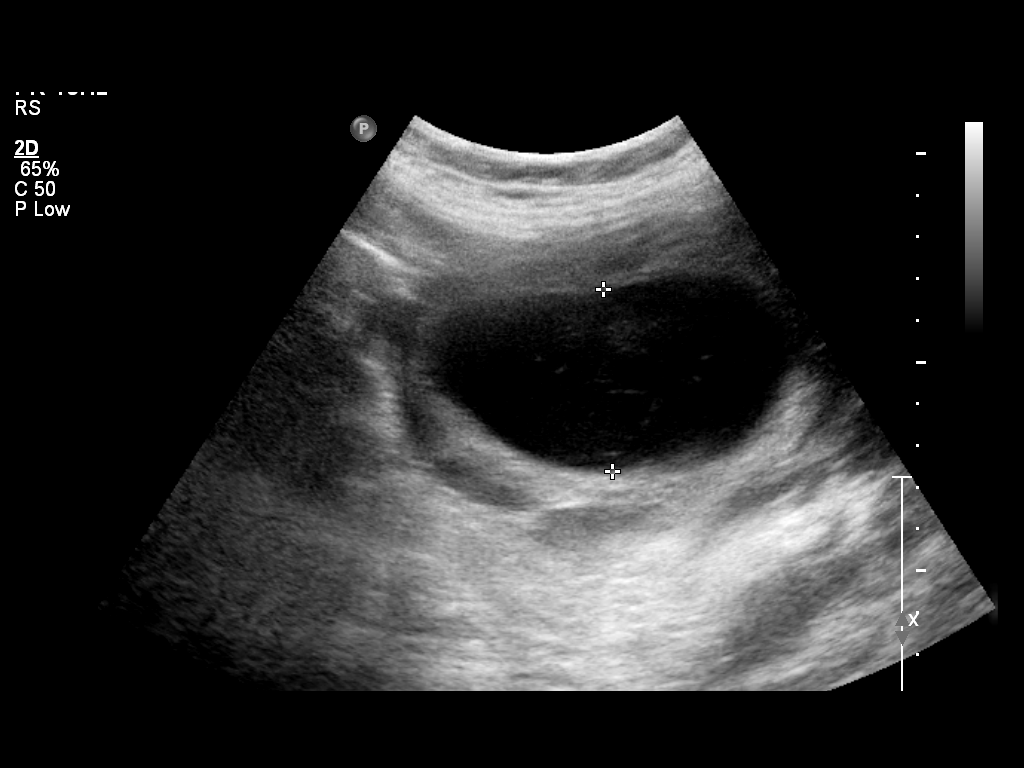
[im 5/5]
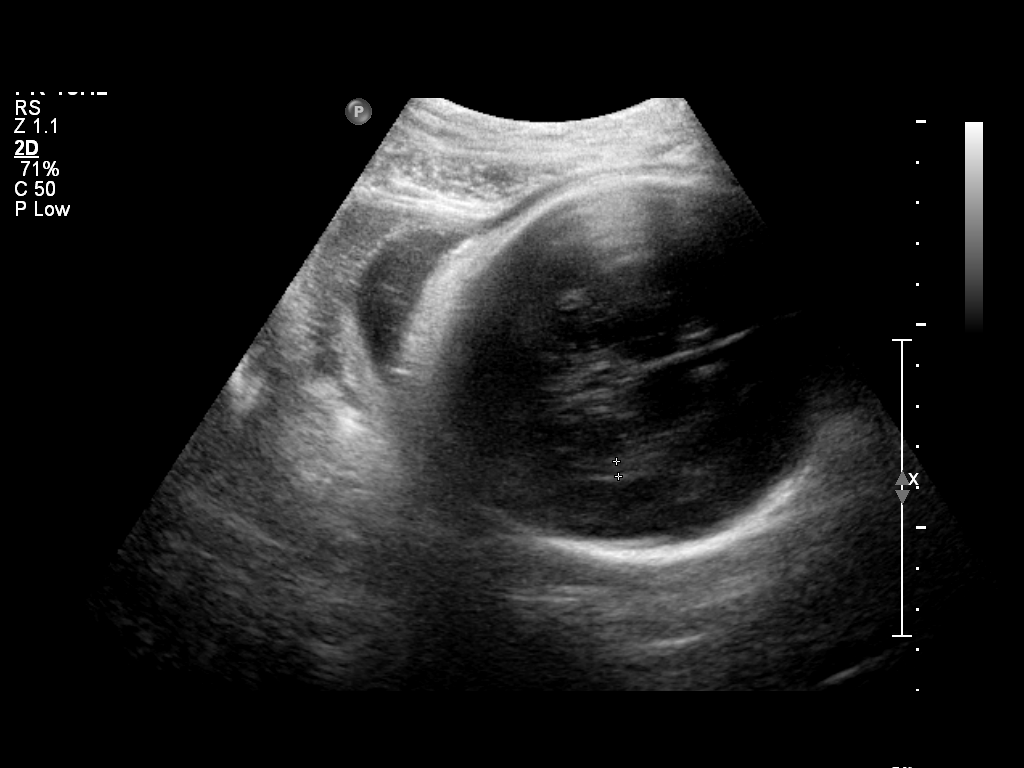

[5 of 5 positions shown; findings below may reference images not displayed]

IMPRESSION: AS OB/GYN has also been faxed to the ordering physician.

## 2009-02-10 IMAGING — US US OB FOLLOW-UP
1 series · 14 of 28 positions shown · non-contrast
Comparison: none

OBSTETRICAL ULTRASOUND:
 This ultrasound was performed in The [HOSPITAL], and the AS OB/GYN report will be stored to [REDACTED] PACS.  This report is also available in [HOSPITAL]?s accessANYware.

[Series 1: us ob follow-up · 0.25mm/px · 36 acquisitions, 14 frames shown]
[im 2/36]
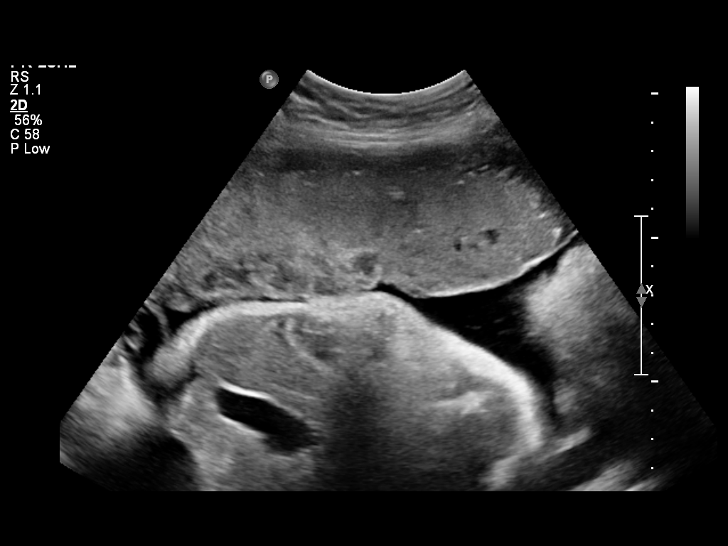
[im 4/36]
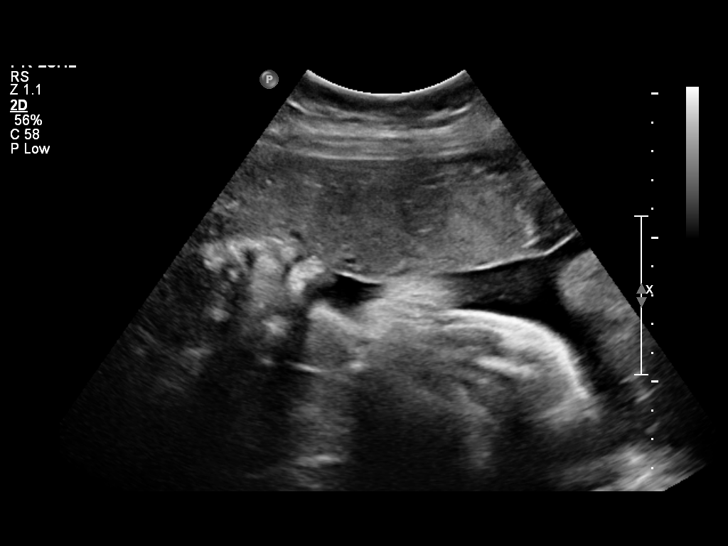
[im 7/36]
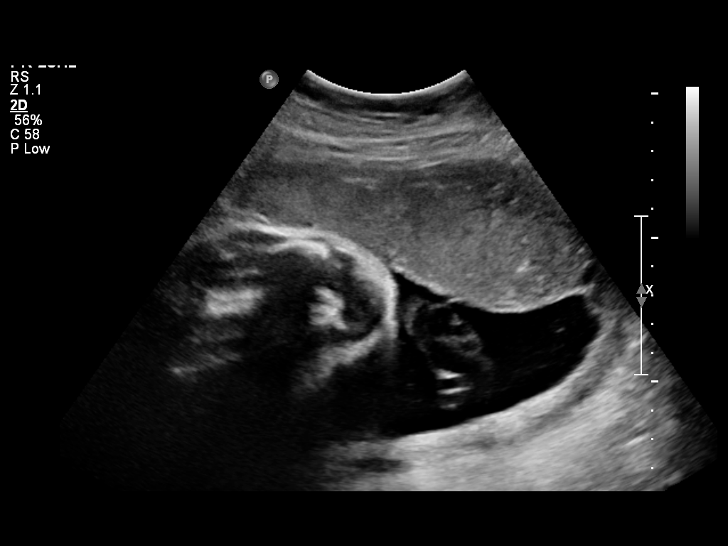
[im 10/36]
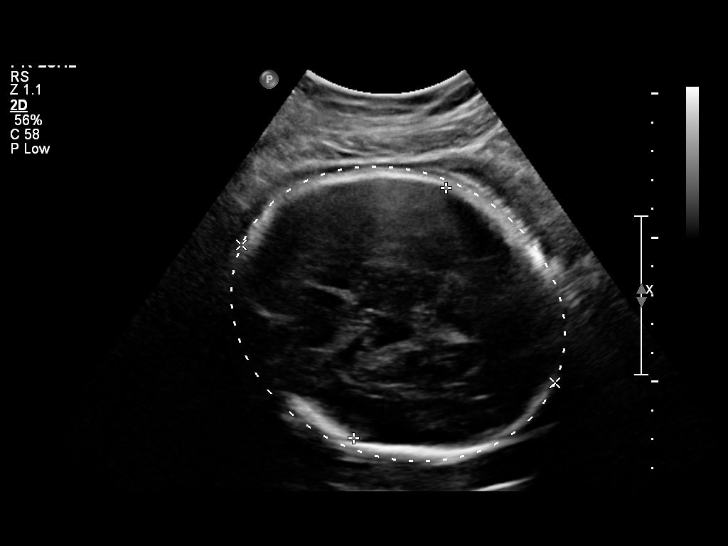
[im 12/36]
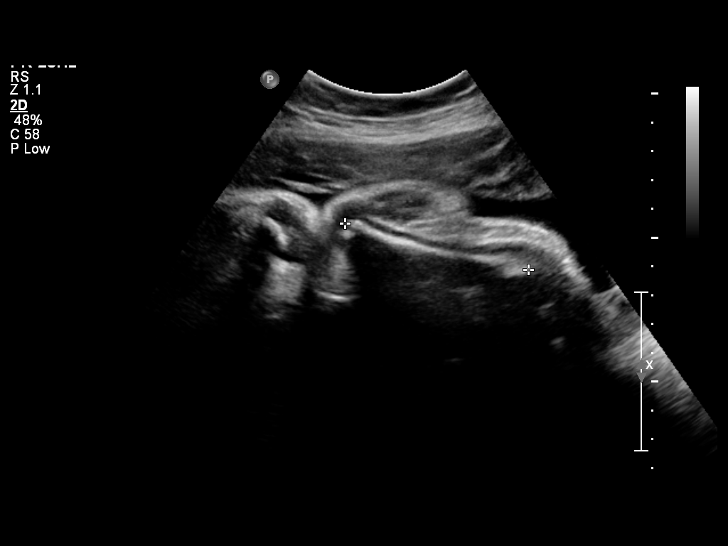
[im 15/36]
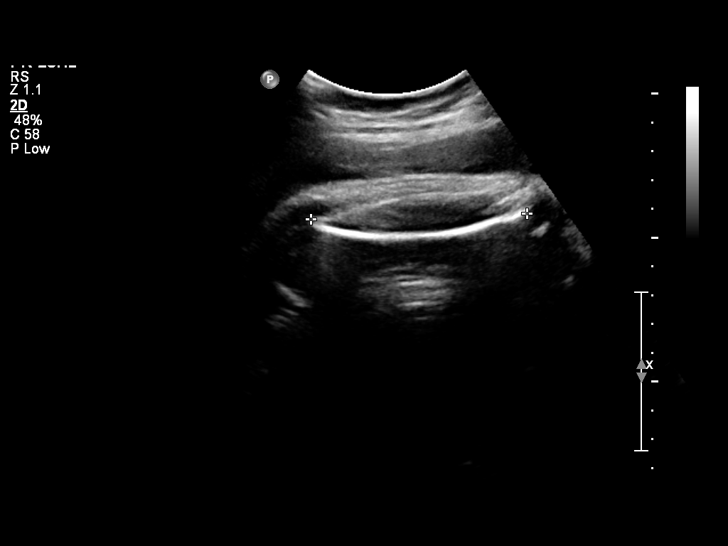
[im 17/36]
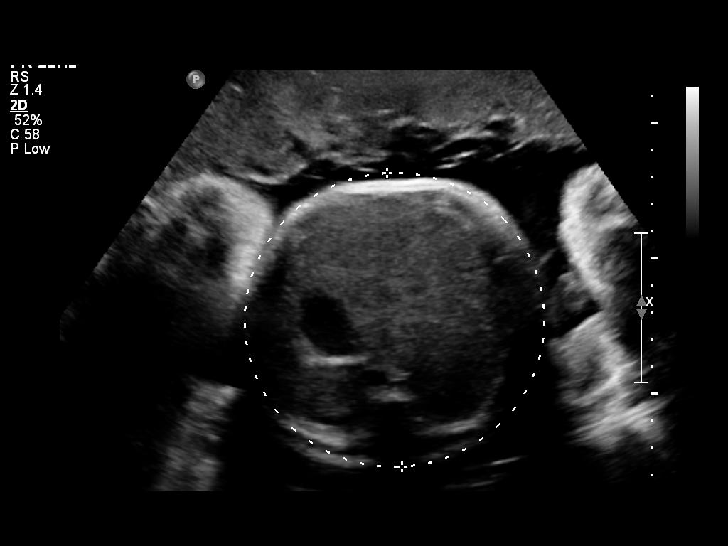
[im 20/36]
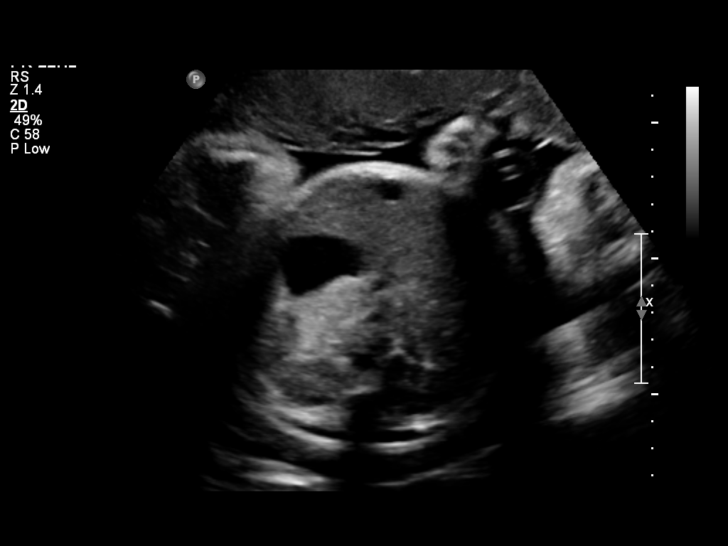
[im 23/36]
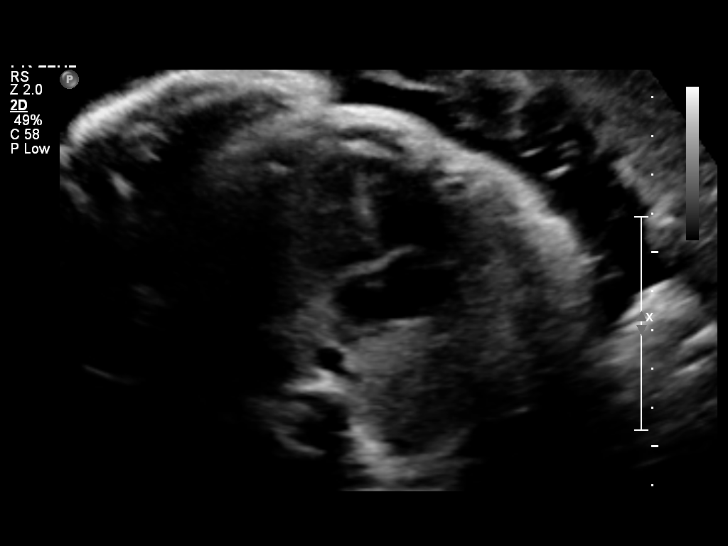
[im 25/36]
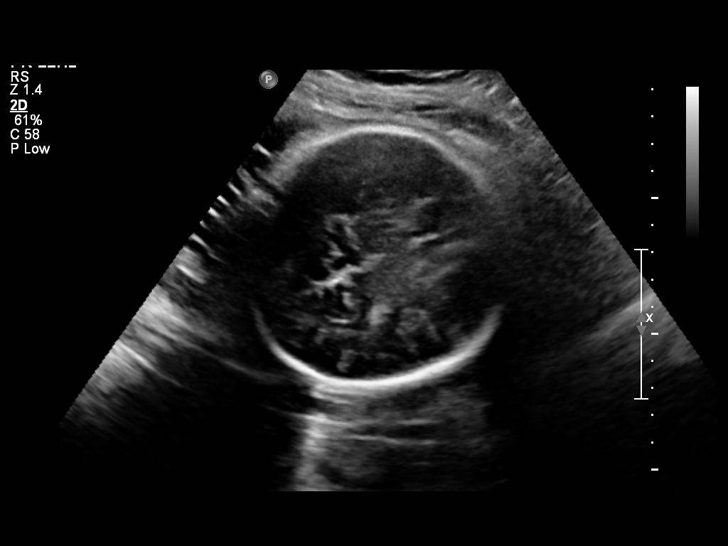
[im 28/36]
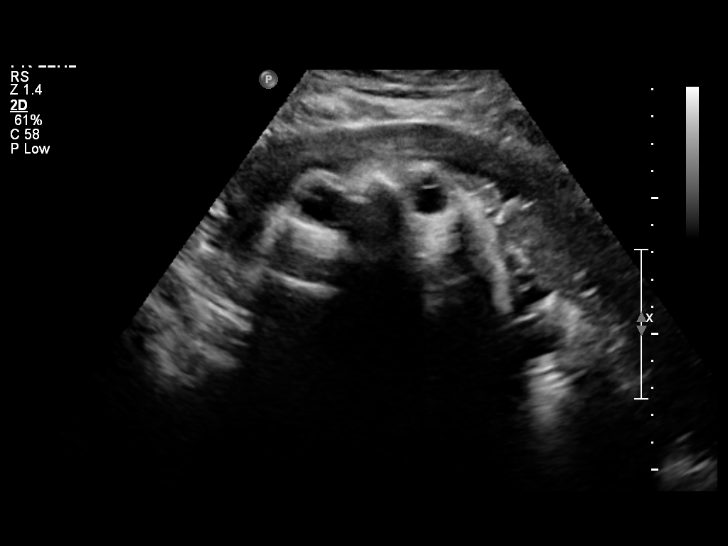
[im 30/36]
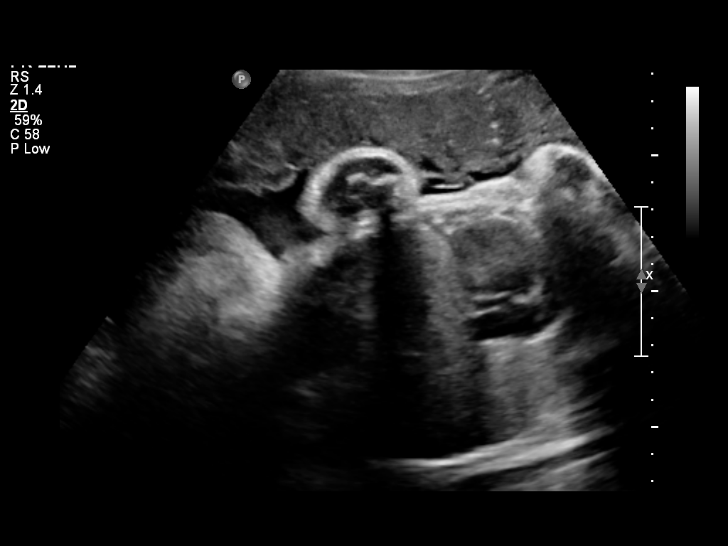
[im 33/36]
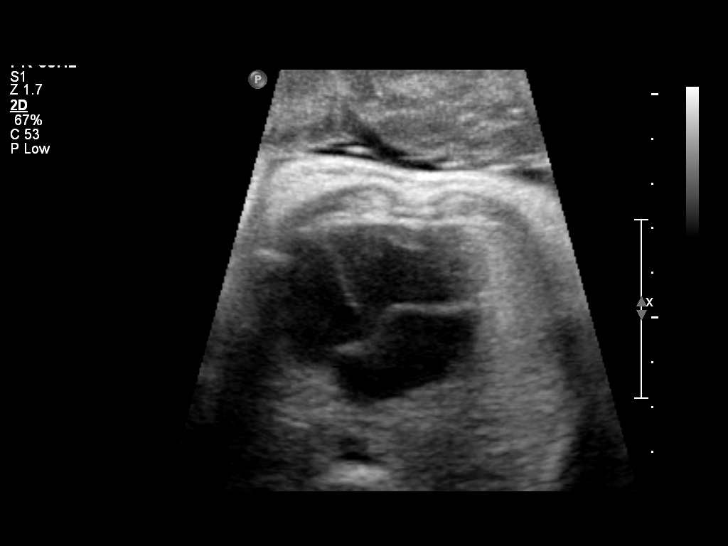
[im 36/36]
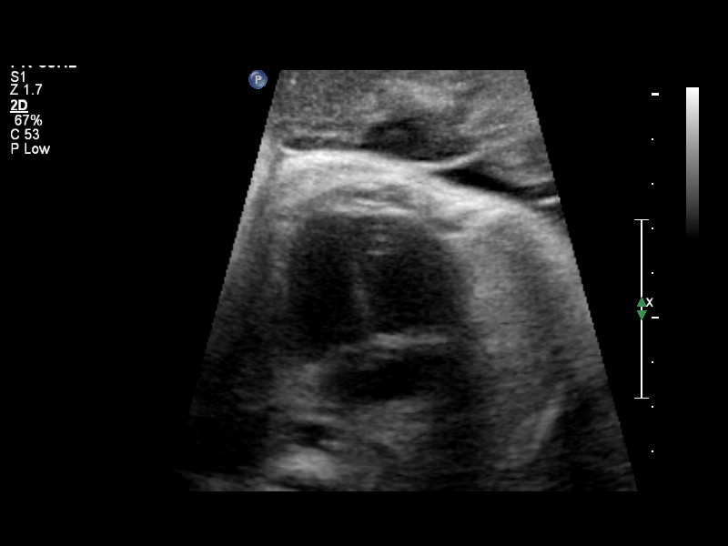

[14 of 28 positions shown; findings below may reference images not displayed]

IMPRESSION: AS OB/GYN has also been faxed to the ordering physician.

## 2009-02-11 ENCOUNTER — Inpatient Hospital Stay (HOSPITAL_COMMUNITY): Admission: AD | Admit: 2009-02-11 | Discharge: 2009-02-11 | Payer: Self-pay | Admitting: Obstetrics & Gynecology

## 2009-02-11 ENCOUNTER — Ambulatory Visit: Payer: Self-pay | Admitting: Obstetrics & Gynecology

## 2009-02-18 ENCOUNTER — Ambulatory Visit: Payer: Self-pay | Admitting: Obstetrics & Gynecology

## 2009-02-22 ENCOUNTER — Ambulatory Visit: Payer: Self-pay | Admitting: Obstetrics & Gynecology

## 2009-02-23 ENCOUNTER — Ambulatory Visit: Payer: Self-pay | Admitting: Obstetrics & Gynecology

## 2009-02-23 ENCOUNTER — Inpatient Hospital Stay (HOSPITAL_COMMUNITY): Admission: AD | Admit: 2009-02-23 | Discharge: 2009-02-25 | Payer: Self-pay | Admitting: Obstetrics & Gynecology

## 2010-03-13 ENCOUNTER — Encounter: Payer: Self-pay | Admitting: Family Medicine

## 2010-05-08 LAB — POCT URINALYSIS DIP (DEVICE)
Bilirubin Urine: NEGATIVE
Glucose, UA: NEGATIVE mg/dL
Hgb urine dipstick: NEGATIVE
Ketones, ur: NEGATIVE mg/dL
Nitrite: NEGATIVE
Protein, ur: NEGATIVE mg/dL
Specific Gravity, Urine: 1.01 (ref 1.005–1.030)
Urobilinogen, UA: 0.2 mg/dL (ref 0.0–1.0)
pH: 7 (ref 5.0–8.0)

## 2010-05-08 LAB — CBC
HCT: 36 % (ref 36.0–46.0)
Hemoglobin: 12.1 g/dL (ref 12.0–15.0)
MCHC: 33.7 g/dL (ref 30.0–36.0)
MCV: 92 fL (ref 78.0–100.0)
Platelets: 143 10*3/uL — ABNORMAL LOW (ref 150–400)
RBC: 3.91 MIL/uL (ref 3.87–5.11)
RDW: 13.6 % (ref 11.5–15.5)
WBC: 12.2 10*3/uL — ABNORMAL HIGH (ref 4.0–10.5)

## 2010-05-08 LAB — RPR: RPR Ser Ql: NONREACTIVE

## 2010-05-23 LAB — POCT URINALYSIS DIP (DEVICE)
Bilirubin Urine: NEGATIVE
Glucose, UA: NEGATIVE mg/dL
Hgb urine dipstick: NEGATIVE
Ketones, ur: 40 mg/dL — AB
Nitrite: NEGATIVE
Protein, ur: NEGATIVE mg/dL
Specific Gravity, Urine: 1.02 (ref 1.005–1.030)
Urobilinogen, UA: 0.2 mg/dL (ref 0.0–1.0)
pH: 7.5 (ref 5.0–8.0)

## 2010-05-24 LAB — POCT URINALYSIS DIP (DEVICE)
Bilirubin Urine: NEGATIVE
Bilirubin Urine: NEGATIVE
Glucose, UA: NEGATIVE mg/dL
Glucose, UA: NEGATIVE mg/dL
Hgb urine dipstick: NEGATIVE
Hgb urine dipstick: NEGATIVE
Ketones, ur: NEGATIVE mg/dL
Ketones, ur: NEGATIVE mg/dL
Nitrite: NEGATIVE
Nitrite: NEGATIVE
Protein, ur: NEGATIVE mg/dL
Protein, ur: NEGATIVE mg/dL
Specific Gravity, Urine: 1.01 (ref 1.005–1.030)
Specific Gravity, Urine: <=1.005 (ref 1.005–1.030)
Urobilinogen, UA: 0.2 mg/dL (ref 0.0–1.0)
Urobilinogen, UA: 0.2 mg/dL (ref 0.0–1.0)
pH: 6.5 (ref 5.0–8.0)
pH: 7 (ref 5.0–8.0)

## 2010-05-25 LAB — URINE MICROSCOPIC-ADD ON

## 2010-05-25 LAB — POCT URINALYSIS DIP (DEVICE)
Bilirubin Urine: NEGATIVE
Bilirubin Urine: NEGATIVE
Bilirubin Urine: NEGATIVE
Glucose, UA: NEGATIVE mg/dL
Glucose, UA: NEGATIVE mg/dL
Glucose, UA: NEGATIVE mg/dL
Hgb urine dipstick: NEGATIVE
Hgb urine dipstick: NEGATIVE
Ketones, ur: NEGATIVE mg/dL
Ketones, ur: NEGATIVE mg/dL
Ketones, ur: NEGATIVE mg/dL
Nitrite: NEGATIVE
Nitrite: NEGATIVE
Nitrite: NEGATIVE
Protein, ur: NEGATIVE mg/dL
Protein, ur: NEGATIVE mg/dL
Protein, ur: NEGATIVE mg/dL
Specific Gravity, Urine: 1.02 (ref 1.005–1.030)
Specific Gravity, Urine: <=1.005 (ref 1.005–1.030)
Specific Gravity, Urine: 1.015 (ref 1.005–1.030)
Urobilinogen, UA: 0.2 mg/dL (ref 0.0–1.0)
Urobilinogen, UA: 0.2 mg/dL (ref 0.0–1.0)
Urobilinogen, UA: 0.2 mg/dL (ref 0.0–1.0)
pH: 5.5 (ref 5.0–8.0)
pH: 6.5 (ref 5.0–8.0)
pH: 7.5 (ref 5.0–8.0)

## 2010-05-25 LAB — WET PREP, GENITAL
Clue Cells Wet Prep HPF POC: NONE SEEN
Trich, Wet Prep: NONE SEEN

## 2010-05-25 LAB — URINALYSIS, ROUTINE W REFLEX MICROSCOPIC
Bilirubin Urine: NEGATIVE
Glucose, UA: NEGATIVE mg/dL
Ketones, ur: NEGATIVE mg/dL
Leukocytes, UA: NEGATIVE
Nitrite: NEGATIVE
Protein, ur: NEGATIVE mg/dL
Specific Gravity, Urine: <1.005 — ABNORMAL LOW (ref 1.005–1.030)
Urobilinogen, UA: 0.2 mg/dL (ref 0.0–1.0)
pH: 7 (ref 5.0–8.0)

## 2010-05-25 LAB — RAPID URINE DRUG SCREEN, HOSP PERFORMED
Amphetamines: NOT DETECTED
Barbiturates: NOT DETECTED
Benzodiazepines: NOT DETECTED
Cocaine: NOT DETECTED
Opiates: NOT DETECTED
Tetrahydrocannabinol: NOT DETECTED

## 2010-05-25 LAB — GC/CHLAMYDIA PROBE AMP, GENITAL
Chlamydia, DNA Probe: NEGATIVE
GC Probe Amp, Genital: NEGATIVE

## 2010-05-26 LAB — POCT URINALYSIS DIP (DEVICE)
Bilirubin Urine: NEGATIVE
Bilirubin Urine: NEGATIVE
Glucose, UA: NEGATIVE mg/dL
Glucose, UA: NEGATIVE mg/dL
Hgb urine dipstick: NEGATIVE
Hgb urine dipstick: NEGATIVE
Ketones, ur: NEGATIVE mg/dL
Ketones, ur: NEGATIVE mg/dL
Nitrite: NEGATIVE
Nitrite: NEGATIVE
Protein, ur: NEGATIVE mg/dL
Protein, ur: NEGATIVE mg/dL
Specific Gravity, Urine: 1.01 (ref 1.005–1.030)
Specific Gravity, Urine: 1.02 (ref 1.005–1.030)
Urobilinogen, UA: 0.2 mg/dL (ref 0.0–1.0)
Urobilinogen, UA: 0.2 mg/dL (ref 0.0–1.0)
pH: 7.5 (ref 5.0–8.0)
pH: 7.5 (ref 5.0–8.0)

## 2010-05-27 LAB — POCT URINALYSIS DIP (DEVICE)
Bilirubin Urine: NEGATIVE
Glucose, UA: NEGATIVE mg/dL
Hgb urine dipstick: NEGATIVE
Ketones, ur: NEGATIVE mg/dL
Nitrite: NEGATIVE
Protein, ur: NEGATIVE mg/dL
Specific Gravity, Urine: 1.015 (ref 1.005–1.030)
Urobilinogen, UA: 0.2 mg/dL (ref 0.0–1.0)
pH: 8.5 — ABNORMAL HIGH (ref 5.0–8.0)

## 2010-05-28 LAB — POCT URINALYSIS DIP (DEVICE)
Bilirubin Urine: NEGATIVE
Glucose, UA: NEGATIVE mg/dL
Hgb urine dipstick: NEGATIVE
Ketones, ur: NEGATIVE mg/dL
Nitrite: NEGATIVE
Protein, ur: NEGATIVE mg/dL
Specific Gravity, Urine: 1.015 (ref 1.005–1.030)
Urobilinogen, UA: 0.2 mg/dL (ref 0.0–1.0)
pH: 8.5 — ABNORMAL HIGH (ref 5.0–8.0)

## 2010-05-29 LAB — POCT URINALYSIS DIP (DEVICE)
Glucose, UA: NEGATIVE mg/dL
Hgb urine dipstick: NEGATIVE
Ketones, ur: NEGATIVE mg/dL
Nitrite: NEGATIVE
Protein, ur: 30 mg/dL — AB
Specific Gravity, Urine: 1.015 (ref 1.005–1.030)
Urobilinogen, UA: 0.2 mg/dL (ref 0.0–1.0)
pH: 7 (ref 5.0–8.0)

## 2011-12-12 ENCOUNTER — Encounter (HOSPITAL_COMMUNITY): Payer: Self-pay | Admitting: *Deleted

## 2011-12-12 ENCOUNTER — Emergency Department (HOSPITAL_COMMUNITY)
Admission: EM | Admit: 2011-12-12 | Discharge: 2011-12-12 | Disposition: A | Discharge status: Home / Self Care | Payer: Self-pay | Attending: Emergency Medicine | Admitting: Emergency Medicine

## 2011-12-12 DIAGNOSIS — K089 Disorder of teeth and supporting structures, unspecified: Secondary | ICD-10-CM | POA: Insufficient documentation

## 2011-12-12 DIAGNOSIS — K047 Periapical abscess without sinus: Secondary | ICD-10-CM | POA: Insufficient documentation

## 2011-12-12 DIAGNOSIS — K0889 Other specified disorders of teeth and supporting structures: Secondary | ICD-10-CM

## 2011-12-12 DIAGNOSIS — F17200 Nicotine dependence, unspecified, uncomplicated: Secondary | ICD-10-CM | POA: Insufficient documentation

## 2011-12-12 MED ORDER — AMOXICILLIN 500 MG PO CAPS: 500.0000 mg | ORAL_CAPSULE | Freq: Three times a day (TID) | ORAL | Status: DC

## 2011-12-12 MED ORDER — HYDROCODONE-ACETAMINOPHEN 5-325 MG PO TABS: 1.0000 | ORAL_TABLET | ORAL | Status: DC | PRN

## 2011-12-12 NOTE — ED Notes (Signed)
Pt dc to home. States understanding to dc instructions.  Pt states will f/u with dentist.

## 2011-12-12 NOTE — ED Provider Notes (Signed)
History     CSN: [id number]  Arrival date & time 12/12/11  0346   First MD Initiated Contact with Patient 12/12/11 0357      Chief Complaint  Patient presents with  . Dental Pain   HPI  History provided by the patient. Patient is a 41 year old female who presents with complaints of left upper tooth pain. Symptoms have been present for the past 3 days. Symptoms have been worsening. Patient reports having poor sleep tonight due to the pain. Patient did take 4 amoxicillin tabs to her left over from her mother's prescription. This did not help any of her symptoms. She is not use any other treatment. Patient is not have a dentist currently. Symptoms are also associated with some swelling to the right cheek and face area. She denies any fever, chills sweats. Denies any difficulty swallowing or breathing. No nausea or vomiting.    History reviewed. No pertinent past medical history.  Past Surgical History  Procedure Date  . Cesarian     No family history on file.  History  Substance Use Topics  . Smoking status: Current Every Day Smoker    Types: Cigarettes  . Smokeless tobacco: Not on file  . Alcohol Use: No    OB History    Grav Para Term Preterm Abortions TAB SAB Ect Mult Living                  Review of Systems  Constitutional: Negative for fever, chills and diaphoresis.  HENT: Positive for dental problem. Negative for trouble swallowing and voice change.   Respiratory: Negative for cough and shortness of breath.   Gastrointestinal: Negative for nausea and vomiting.    Allergies  Review of patient's allergies indicates no known allergies.  Home Medications  No current outpatient prescriptions on file.  BP 141/96  Pulse 71  Temp 99 F (37.2 C) (Oral)  Resp 18  SpO2 99%  LMP 12/07/2011  Physical Exam  Nursing note and vitals reviewed. Constitutional: She is oriented to person, place, and time. She appears well-developed and well-nourished. No distress.    HENT:  Head: Normocephalic.  Mouth/Throat:         Several teeth have been removed. Some dental caries present. There is fracture and dental caries the left upper first premolar with pain to percussion. No significant swelling of the adjacent gums or fluctuance. Patient does have slight loss of left nasolabial fold. Skin otherwise appears normal without erythema or induration.  Cardiovascular: Normal rate and regular rhythm.   Pulmonary/Chest: Effort normal and breath sounds normal.  Neurological: She is alert and oriented to person, place, and time.  Skin: Skin is warm and dry. No rash noted.  Psychiatric: She has a normal mood and affect. Her behavior is normal.    ED Course  Procedures  Dental Block Performed by: Angus Seller Authorized by: Angus Seller Consent: Verbal consent obtained. Risks and benefits: risks, benefits and alternatives were discussed Consent given by: patient Patient identity confirmed: provided demographic data  Location: Left upper first premolar  Local anesthetic: Bupivacaine 0.5% with epinephrine  Anesthetic total: 1.8 ml  Irrigation method: syringe  Patient tolerance: Patient tolerated the procedure well with no immediate complications. Pain improved.     1. Periapical abscess   2. Pain, dental       MDM  4:05 AM patient seen and evaluated. Patient appears uncomfortable but in no acute distress.        Angus Seller, PA 12/12/11  0426 

## 2011-12-12 NOTE — ED Notes (Signed)
Pt states has had tooth ache x 2 days.  Pa at bedside for injection.  Pt states has much relief now.

## 2011-12-12 NOTE — ED Notes (Signed)
PT to ED c/o dental pain to upper L tooth.  Sine Sunday her L cheek has started swelling.  She also took 4 amoxicillin that her mom had.

## 2011-12-12 NOTE — ED Provider Notes (Signed)
Medical screening examination/treatment/procedure(s) were performed by non-physician practitioner and as supervising physician I was immediately available for consultation/collaboration.  Olivia Mackie, MD 12/12/11 [phone]

## 2018-12-13 ENCOUNTER — Encounter: Payer: Self-pay | Admitting: Family Medicine

## 2018-12-13 ENCOUNTER — Other Ambulatory Visit: Payer: Self-pay

## 2018-12-13 ENCOUNTER — Ambulatory Visit (INDEPENDENT_AMBULATORY_CARE_PROVIDER_SITE_OTHER): Payer: Self-pay | Admitting: Family Medicine

## 2018-12-13 VITALS — BP 116/77 | HR 67 | Ht 66.0 in | Wt 179.0 lb

## 2018-12-13 DIAGNOSIS — M79602 Pain in left arm: Secondary | ICD-10-CM

## 2018-12-13 DIAGNOSIS — M79601 Pain in right arm: Secondary | ICD-10-CM

## 2018-12-13 DIAGNOSIS — M25551 Pain in right hip: Secondary | ICD-10-CM

## 2018-12-13 MED ORDER — PREDNISONE 5 MG PO TABS: ORAL_TABLET | ORAL | 0 refills | Status: DC

## 2018-12-13 MED ORDER — GABAPENTIN 100 MG PO CAPS: 100.0000 mg | ORAL_CAPSULE | Freq: Three times a day (TID) | ORAL | 1 refills | Status: DC

## 2018-12-13 NOTE — Patient Instructions (Signed)
Nice to meet you Please try the exercises  Please try the prednisone  Please start with the gabapentin. You can start with one pill at night and then work up to 2 or 3 times daily as you tolerate  I will call you with the xrays  Please send me a message in [place] with any questions or updates.  Please see me back in 4 weeks.   --Dr. Raeford Razor

## 2018-12-13 NOTE — Progress Notes (Addendum)
Natalie Miles - 48 y.o. female MRN [id number]  Date of birth: [birth year]  SUBJECTIVE:  Including CC & ROS.  Chief Complaint  Patient presents with  . Back Pain    midline low back  . Shoulder Pain    bilateral  . Arm Pain    bilateral  . Hand Pain    bilateral    Natalie Miles is a 48 y.o. female that is presenting with bilateral upper arm numbness.  This is been ongoing for several years.  She experiences this pain when she lies down flat or when she is working with her arms on a regular basis.  She denies any specific inciting event.  It is stayed the same.  No history of surgery on her neck or shoulders.  Has not tried any physical therapy or medications at this time.  Feels like the symptoms are more constant.  Can alleviate the pain if she puts her arms behind her back.  Reporting right-sided groin pain.  She feels this more more at the origin of the abductors.  This is been ongoing for several years.  She feels that her whole hip will be locked up when it gets severe.  It is worse when she walks for long periods of time.  The pain radiates posteriorly.  She denies any specific event.  No prior bruising or ecchymosis when initially happened.  Still occurs intermittently.  Is severe when it does occur.  Very sharp.  No prior history of physical therapy or surgery.  No measures tried to date.   Review of Systems  Constitutional: Negative for fever.  HENT: Negative for congestion.   Respiratory: Negative for cough.   Cardiovascular: Negative for chest pain.  Gastrointestinal: Negative for abdominal distention.  Musculoskeletal: Positive for arthralgias and back pain.  Skin: Negative for color change.  Neurological: Negative for weakness.  Hematological: Negative for adenopathy.    HISTORY: Past Medical, Surgical, Social, and Family History Reviewed & Updated per EMR.   Pertinent Historical Findings include:  No past medical history on file.  Past Surgical History:   Procedure Laterality Date  . cesarian      No Known Allergies  No family history on file.   Social History   Socioeconomic History  . Marital status: Single    Spouse name: Not on file  . Number of children: Not on file  . Years of education: Not on file  . Highest education level: Not on file  Occupational History  . Not on file  Social Needs  . Financial resource strain: Not on file  . Food insecurity    Worry: Not on file    Inability: Not on file  . Transportation needs    Medical: Not on file    Non-medical: Not on file  Tobacco Use  . Smoking status: Current Every Day Smoker    Types: Cigarettes  . Smokeless tobacco: Never Used  Substance and Sexual Activity  . Alcohol use: No  . Drug use: No  . Sexual activity: Not on file  Lifestyle  . Physical activity    Days per week: Not on file    Minutes per session: Not on file  . Stress: Not on file  Relationships  . Social Herbalist on phone: Not on file    Gets together: Not on file    Attends religious service: Not on file    Active member of club or organization: Not on file  Attends meetings of clubs or organizations: Not on file    Relationship status: Not on file  . Intimate partner violence    Fear of current or ex partner: Not on file    Emotionally abused: Not on file    Physically abused: Not on file    Forced sexual activity: Not on file  Other Topics Concern  . Not on file  Social History Narrative  . Not on file     PHYSICAL EXAM:  VS: BP 116/77   Pulse 67   Ht 5\' 6"  (1.676 m)   Wt 179 lb (81.2 kg)   BMI 28.89 kg/m  Physical Exam Gen: NAD, alert, cooperative with exam, well-appearing ENT: normal lips, normal nasal mucosa,  Eye: normal EOM, normal conjunctiva and lids CV:  no edema, +2 pedal pulses   Resp: no accessory muscle use, non-labored,   Skin: no rashes, no areas of induration  Neuro: normal tone, normal sensation to touch Psych:  normal insight, alert and  oriented MSK:  Neck: No tenderness palpation of the cervical spine. Normal range of motion in flexion and extension. Normal lateral rotation. Right and left arm: No signs of atrophy. Normal abduction and flexion actively of the shoulders Normal internal and external rotation of the shoulders Normal grip strength. Normal pincer grasp. Normal elbow range of motion. Right hip: No tenderness palpation over the greater trochanter or SI joint. Normal hip flexion. Normal strength with one leg standing. Normal internal and external rotation. Weakness with hip abduction. Neurovascular intact     ASSESSMENT & PLAN:   Right hip pain Pain is possibly labral in nature as she appears to have mechanical symptoms.  She is experiencing the pain near the origin of the abductors.  Reports having a normal pelvic x-ray previously and will provide with that. -Counseled on home exercise therapy and supportive care. -Can consider intra-articular joint injection. -X-ray.  Bilateral arm pain This abnormal sensation is occurring in a glovelike distribution down the left and right arm.  Seems more likely to be a neuritis and possibly associated with the brachial plexus but unusual as why it is bilateral. -Prednisone. -Gabapentin. -May need to consider nerve study if no improvement.

## 2018-12-16 DIAGNOSIS — M79601 Pain in right arm: Secondary | ICD-10-CM | POA: Insufficient documentation

## 2018-12-16 DIAGNOSIS — M79602 Pain in left arm: Secondary | ICD-10-CM | POA: Insufficient documentation

## 2018-12-16 DIAGNOSIS — M25551 Pain in right hip: Secondary | ICD-10-CM | POA: Insufficient documentation

## 2018-12-16 NOTE — Assessment & Plan Note (Signed)
Pain is possibly labral in nature as she appears to have mechanical symptoms.  She is experiencing the pain near the origin of the abductors.  Reports having a normal pelvic x-ray previously and will provide [place] with that. -Counseled on home exercise therapy and supportive care. -Can consider intra-articular joint injection. -X-ray.

## 2018-12-16 NOTE — Assessment & Plan Note (Addendum)
This abnormal sensation is occurring in a glovelike distribution down the left and right arm.  Seems more likely to be a neuritis and possibly associated with the brachial plexus but unusual as why it is bilateral. -Prednisone. -Gabapentin. -May need to consider nerve study if no improvement.

## 2019-01-10 ENCOUNTER — Ambulatory Visit: Payer: Self-pay | Admitting: Family Medicine

## 2019-01-13 ENCOUNTER — Telehealth: Payer: Self-pay | Admitting: Family Medicine

## 2019-01-13 MED ORDER — GABAPENTIN 100 MG PO CAPS: 100.0000 mg | ORAL_CAPSULE | Freq: Three times a day (TID) | ORAL | 1 refills | Status: DC

## 2019-01-13 NOTE — Telephone Encounter (Signed)
Patient is requesting refill of Gabapentin. She currently only has two pills left. She is out of town for work and is requesting medication to be sent to [facility] on [address], [place], [place] [address]  Patient works for the [employer] and has been doing a lot of walking. She is having significant groin pain and is asking for advice on what to do or if there is medication she can take to help with the pain

## 2019-01-13 NOTE — Telephone Encounter (Signed)
Spoke with patient. Having improvement with gabapentin. Refill sent in.   Rosemarie Ax, MD [facility] 01/13/2019, 9:20 AM

## 2019-01-15 ENCOUNTER — Other Ambulatory Visit: Payer: Self-pay | Admitting: *Deleted

## 2019-01-15 ENCOUNTER — Telehealth: Payer: Self-pay

## 2019-01-15 MED ORDER — GABAPENTIN 100 MG PO CAPS: 100.0000 mg | ORAL_CAPSULE | Freq: Three times a day (TID) | ORAL | 1 refills | Status: DC

## 2020-05-26 ENCOUNTER — Ambulatory Visit (INDEPENDENT_AMBULATORY_CARE_PROVIDER_SITE_OTHER): Payer: Self-pay | Admitting: Family Medicine

## 2020-05-26 ENCOUNTER — Other Ambulatory Visit: Payer: Self-pay

## 2020-05-26 VITALS — BP 139/86 | Ht 66.0 in | Wt 180.0 lb

## 2020-05-26 DIAGNOSIS — M79601 Pain in right arm: Secondary | ICD-10-CM

## 2020-05-26 DIAGNOSIS — M79602 Pain in left arm: Secondary | ICD-10-CM

## 2020-05-26 MED ORDER — GABAPENTIN 100 MG PO CAPS: 100.0000 mg | ORAL_CAPSULE | Freq: Three times a day (TID) | ORAL | 1 refills | Status: DC

## 2020-05-26 NOTE — Patient Instructions (Signed)
It was great to meet you today! Thank you for letting me participate in your care!  Today, we discussed your bilateral arm numbness which could be caused by multiple different things. To fully investigate it would be helpful to order some imaging tests but it would be better for you if we wait until you get insurance. I have refilled your Gabapentin and increased the dose. Please return in 4 weeks so I can check your progress.  You need to establish care with a primary care provider to help you with your other health needs and concerns. This is of the upmost importance for you. Please do this as soon as you can.  Be well, Jules Schick, DO PGY-4, Sports Medicine Fellow [facility]

## 2020-05-26 NOTE — Progress Notes (Addendum)
    SUBJECTIVE:   CHIEF COMPLAINT / HPI:   Bilateral arm numbness [name] is a 50 year old female who is a new patient to this clinic presenting today with bilateral arm numbness is her chief complaint.  She has a myriad of other conditions which are bothering her such as frequent urination, hot flashes, and difficulty sleep and difficulty with mood however I did let her know these were beyond my scope of practice and sports medicine.  I did encourage her highly to get a primary care physician and establish care where she could get consistent follow-up.  She states she has had this bilateral arm numbness and tingling for 6 or 7 years.  It gets worse with activity and wakes her up at nighttime.  It is progressively gotten to the point where it is constant.  She denies any neck pain or discomfort with neck movement.  She was seen by another sports medicine physician a few months ago and prescribed gabapentin says it does help by taking the edge off but never really relieves the pain.  PERTINENT  PMH / PSH: None  OBJECTIVE:   BP 139/86   Ht _0  (1.676 m)   Wt 180 lb (81.6 kg)   BMI 29.05 kg/m   No flowsheet data found.  Cervical spine:  - Inspection: no gross deformity or asymmetry, swelling or ecchymosis. No skin changes - Palpation: No TTP over the spinous processes, paraspinal muscles of the cervical spine - ROM: full active ROM of the cervical spine in flexion and extension without pain; side bending and rotation equal and symmetric without pain - Strength: 5/5 strength of upper extremities in C5-T1 nerve root distributions b/l. Grip, pincer, finger abduction strength equal and symmetric. Bicep, triceps, and shoulder shrug strength equal and symmetric. - Neuro: sensation intact in the C5-T! nerve root distribution b/l, 2+ of triceps, biceps, and supinator reflexes   ASSESSMENT/PLAN:   Bilateral arm pain Patient with bilateral arm numbness and reported heaviness with  unclear etiology. She is self pay so I did try to limit expensive tests until she got insurance coverage.  - I recommended for her to establish care with a PCP as there are many conditions outside the scope of sports medicine that could be causing her symptoms. I also offered her to a referral to Neurology for EMG and nerve conduction study vs MRI to further evaluate the nature of her symptoms. She declines. She requested increase in her gabapentin to 341m TID but without recent kidney function I did not think it safe to increase it to that level. I did let her know I would put in an order for a BMP to check her kidney function and if normal an increase in Gabapentin could be reasonable. She found this unacceptable and left. - I did refill her previous gabapentin prescription     TNuala Alpha DO PGY-4, Sports Medicine Fellow [facility] Addendum:  I was the preceptor for this visit and available for immediate consultation.  SKarlton LemonMD [facility]

## 2020-05-27 NOTE — Assessment & Plan Note (Signed)
Patient with bilateral arm numbness and reported heaviness with unclear etiology. She is self pay so I did try to limit expensive tests until she got insurance coverage.  - I recommended for her to establish care with a PCP as there are many conditions outside the scope of sports medicine that could be causing her symptoms. I also offered her to a referral to Neurology for EMG and nerve conduction study vs MRI to further evaluate the nature of her symptoms. She declines. She requested increase in her gabapentin to 300mg  TID but without recent kidney function I did not think it safe to increase it to that level. I did let her know I would put in an order for a BMP to check her kidney function and if normal an increase in Gabapentin could be reasonable. She found this unacceptable and left. - I did refill her previous gabapentin prescription

## 2021-12-06 DIAGNOSIS — Z1388 Encounter for screening for disorder due to exposure to contaminants: Secondary | ICD-10-CM | POA: Diagnosis not present

## 2021-12-06 DIAGNOSIS — Z0389 Encounter for observation for other suspected diseases and conditions ruled out: Secondary | ICD-10-CM | POA: Diagnosis not present

## 2021-12-06 DIAGNOSIS — Z3009 Encounter for other general counseling and advice on contraception: Secondary | ICD-10-CM | POA: Diagnosis not present

## 2021-12-21 DIAGNOSIS — Z419 Encounter for procedure for purposes other than remedying health state, unspecified: Secondary | ICD-10-CM | POA: Diagnosis not present

## 2022-01-20 DIAGNOSIS — Z419 Encounter for procedure for purposes other than remedying health state, unspecified: Secondary | ICD-10-CM | POA: Diagnosis not present

## 2022-01-31 DIAGNOSIS — M797 Fibromyalgia: Secondary | ICD-10-CM | POA: Diagnosis not present

## 2022-02-20 DIAGNOSIS — Z419 Encounter for procedure for purposes other than remedying health state, unspecified: Secondary | ICD-10-CM | POA: Diagnosis not present

## 2022-03-23 DIAGNOSIS — Z419 Encounter for procedure for purposes other than remedying health state, unspecified: Secondary | ICD-10-CM | POA: Diagnosis not present

## 2022-04-06 DIAGNOSIS — M47896 Other spondylosis, lumbar region: Secondary | ICD-10-CM | POA: Diagnosis not present

## 2022-04-06 DIAGNOSIS — M4316 Spondylolisthesis, lumbar region: Secondary | ICD-10-CM | POA: Diagnosis not present

## 2022-04-06 DIAGNOSIS — M5442 Lumbago with sciatica, left side: Secondary | ICD-10-CM | POA: Diagnosis not present

## 2022-04-21 DIAGNOSIS — Z419 Encounter for procedure for purposes other than remedying health state, unspecified: Secondary | ICD-10-CM | POA: Diagnosis not present

## 2022-05-22 DIAGNOSIS — Z419 Encounter for procedure for purposes other than remedying health state, unspecified: Secondary | ICD-10-CM | POA: Diagnosis not present

## 2022-06-06 ENCOUNTER — Encounter: Payer: Self-pay | Admitting: *Deleted

## 2022-07-04 ENCOUNTER — Telehealth: Payer: Self-pay

## 2022-07-04 NOTE — Telephone Encounter (Signed)
LVM for patient to call back. AS, CMA 

## 2024-03-19 NOTE — Patient Instructions (Incomplete)

## 2024-03-20 ENCOUNTER — Ambulatory Visit: Admitting: Sports Medicine

## 2024-03-26 ENCOUNTER — Ambulatory Visit: Admitting: Sports Medicine

## 2024-03-26 ENCOUNTER — Encounter: Payer: Self-pay | Admitting: Sports Medicine

## 2024-03-26 VITALS — BP 94/57 | HR 64 | Temp 98.6°F | Ht 64.0 in | Wt 205.8 lb

## 2024-03-26 DIAGNOSIS — Z131 Encounter for screening for diabetes mellitus: Secondary | ICD-10-CM

## 2024-03-26 DIAGNOSIS — R635 Abnormal weight gain: Secondary | ICD-10-CM

## 2024-03-26 DIAGNOSIS — M79672 Pain in left foot: Secondary | ICD-10-CM

## 2024-03-26 DIAGNOSIS — Z113 Encounter for screening for infections with a predominantly sexual mode of transmission: Secondary | ICD-10-CM

## 2024-03-26 DIAGNOSIS — Z13220 Encounter for screening for lipoid disorders: Secondary | ICD-10-CM

## 2024-03-26 DIAGNOSIS — M2550 Pain in unspecified joint: Secondary | ICD-10-CM

## 2024-03-26 DIAGNOSIS — K219 Gastro-esophageal reflux disease without esophagitis: Secondary | ICD-10-CM

## 2024-03-26 MED ORDER — DULOXETINE HCL 60 MG PO CPEP: 60.0000 mg | ORAL_CAPSULE | Freq: Every day | ORAL | 0 refills | Status: AC

## 2024-03-26 NOTE — Progress Notes (Signed)
 "  New Patient Office Visit  Patient ID: Natalie Miles, Female   DOB: 15-May-1970 54 y.o. MRN: [id number] Subjective:     Discussed the use of AI scribe software for clinical note transcription with the patient, who gave verbal consent to proceed.  History of Present Illness   Natalie Miles is a 54 year old female with fibromyalgia who presents to establish care and worsening body pain and stiffness.  She experiences excruciating pain throughout her body, including cramps in her hands, feet, and legs. The pain has been present for approximately three years and has progressively worsened. Her right arm feels heavy and painful from the shoulder down, with extreme pain in the elbow. She describes her body as feeling as though it 'doesn't want to be used.' Morning stiffness lasts about 30 minutes and improves as the day progresses. She has difficulty moving, bending over, and performing tasks at work, such as pulling truck doors. She describes moving in 'slow motion.'  She takes methocarbamol at night for muscle pain, but it is not always effective. Blood work done within the last year showed no alarming results, and no x-rays have been performed. Significant pain is present in her feet, particularly from the ankle down, with a knot in her left foot present for about a year. Major pain occurs when walking, and she cannot bend her feet properly.  She experiences bloating and heartburn after eating, for which she takes Prilosec daily. She eats only once a day and has noticed weight gain, now weighing 205 pounds . Red spots have appeared on her stomach since she started experiencing bloating. These spots do not itch but are a new development.  No history of asthma, COPD, heart problems, or smoking. She does not consume alcohol. She reports feeling hot flashes but no significant weight loss, fevers, or night sweats. She has regular bowel movements and denies any chronic diarrhea or stomach pain. No  dizziness, lightheadedness, tingling or numbness in feet, breathing problems, or itching of red spots.  Outpatient Encounter Medications as of 03/26/2024  Medication Sig   methocarbamol (ROBAXIN) 500 MG tablet Take 500 mg by mouth 4 (four) times daily.   gabapentin  (NEURONTIN ) 100 MG capsule Take 1 capsule (100 mg total) by mouth 3 (three) times daily. (Patient not taking: Reported on 03/26/2024)   No facility-administered encounter medications on file as of 03/26/2024.    No past medical history on file.  Past Surgical History:  Procedure Laterality Date   cesarian      No family history on file.  Social History   Socioeconomic History   Marital status: Single    Spouse name: Not on file   Number of children: Not on file   Years of education: Not on file   Highest education level: Not on file  Occupational History   Not on file  Tobacco Use   Smoking status: Every Day    Types: Cigarettes   Smokeless tobacco: Never  Substance and Sexual Activity   Alcohol use: No   Drug use: No   Sexual activity: Not on file  Other Topics Concern   Not on file  Social History Narrative   Not on file   Social Drivers of Health   Tobacco Use: Not on file  Financial Resource Strain: Not on file  Food Insecurity: Not on file  Transportation Needs: Not on file  Physical Activity: Not on file  Stress: Not on file  Social Connections: Not on file  Intimate Partner Violence: Not on file  Depression (EYV7-0): Not on file  Alcohol Screen: Not on file  Housing: Not on file  Utilities: Not on file  Health Literacy: Not on file    Review of Systems  Constitutional:  Negative for chills and fever.  HENT:  Negative for congestion and sore throat.   Eyes:  Negative for blurred vision and double vision.  Respiratory:  Negative for cough, sputum production and shortness of breath.   Cardiovascular:  Negative for chest pain, palpitations and leg swelling.  Gastrointestinal:  Negative for  abdominal pain, heartburn and nausea.  Genitourinary:  Negative for dysuria, frequency and hematuria.  Musculoskeletal:  Positive for joint pain and myalgias. Negative for falls.  Neurological:  Negative for dizziness, sensory change and focal weakness.  Psychiatric/Behavioral:  Negative for depression. The patient does not have insomnia.      Objective:    BP (!) 94/57   Pulse 64   Temp 98.6 F (37 C) (Oral)   Ht 5' 4 (1.626 m)   Wt 205 lb 12.8 oz (93.4 kg)   SpO2 98%   BMI 35.33 kg/m   Physical Exam Constitutional:      Appearance: Normal appearance.  HENT:     Head: Normocephalic and atraumatic.  Cardiovascular:     Rate and Rhythm: Normal rate and regular rhythm.  Pulmonary:     Effort: Pulmonary effort is normal. No respiratory distress.     Breath sounds: Normal breath sounds. No wheezing.  Abdominal:     General: Bowel sounds are normal. There is no distension.     Tenderness: There is no abdominal tenderness. There is no guarding or rebound.     Comments:    Musculoskeletal:        General: No swelling or tenderness.     Comments: Left foot - raised area on left foot , cystic  No redness, tender to palpate  Shoulders- empty can neg Rom intact  Skin:    General: Skin is dry.  Neurological:     Mental Status: She is alert. Mental status is at baseline.     Sensory: No sensory deficit.     Motor: No weakness.     Last CBC Lab Results  Component Value Date   WBC 12.2 (H) 02/23/2009   HGB 12.1 02/23/2009   HCT 36.0 02/23/2009   MCV 92.0 02/23/2009   RDW 13.6 02/23/2009   PLT 143 PLATELET COUNT CONFIRMED BY SMEAR (L) 02/23/2009   Last metabolic panel No results found for: GLUCOSE, NA, K, CL, CO2, BUN, CREATININE, EGFR, CALCIUM, PHOS, PROT, ALBUMIN, LABGLOB, AGRATIO, BILITOT, ALKPHOS, AST, ALT, ANIONGAP Last lipids No results found for: CHOL, HDL, LDLCALC, LDLDIRECT, TRIG, CHOLHDL Last hemoglobin  A1c No results found for: HGBA1C Last thyroid functions No results found for: TSH, T3TOTAL, T4TOTAL, FREET4, THYROIDAB Last vitamin D No results found for: 25OHVITD2, 25OHVITD3, VD25OH Last vitamin B12 and Folate No results found for: VITAMINB12, FOLATE     Assessment & Plan:   Assessment & Plan Polyarthralgia Worsening joint pains C/o morning stiffness Will check labs Will start cymbalta  Orders:   Sedimentation rate   C-reactive protein   Anti-CCP Ab, IgG + IgA (RDL)   DULoxetine  (CYMBALTA ) 60 MG capsule; Take 1 capsule (60 mg total) by mouth daily.   CBC with Differential/Platelet; Future   COMPLETE METABOLIC PANEL WITHOUT GFR; Future  Left foot pain C/o knot on her left foot No recent trauma Orders:   Ambulatory referral to  Podiatry  Gastroesophageal reflux disease, unspecified whether esophagitis present Denies acid reflux Cont with PPI    Weight gain Will check cortisol Orders:   TSH; Future   Cortisol  Screening for STD (sexually transmitted disease)  Orders:   Hepatitis C Antibody  Screening for diabetes mellitus  Orders:   Hemoglobin A1c; Future  Screening for lipid disorders  Orders:   Lipid panel; Future   No follow-ups on file.   Jackalyn Blazing, MD [facility]    "

## 2024-03-26 NOTE — Patient Instructions (Signed)

## 2024-04-01 ENCOUNTER — Ambulatory Visit: Admitting: Podiatry

## 2024-04-01 ENCOUNTER — Other Ambulatory Visit

## 2024-05-07 ENCOUNTER — Ambulatory Visit: Admitting: Sports Medicine
